# Patient Record
Sex: Female | Born: 1965 | Race: White | Hispanic: Yes | Marital: Married | Smoking: Never smoker
Health system: Southern US, Community
[De-identification: ages and names within clinical notes are randomized; demographics above are authoritative.]

## PROBLEM LIST (undated history)

## (undated) DIAGNOSIS — E119 Type 2 diabetes mellitus without complications: Secondary | ICD-10-CM

## (undated) DIAGNOSIS — N83209 Unspecified ovarian cyst, unspecified side: Secondary | ICD-10-CM

## (undated) DIAGNOSIS — R0602 Shortness of breath: Secondary | ICD-10-CM

## (undated) DIAGNOSIS — N189 Chronic kidney disease, unspecified: Secondary | ICD-10-CM

## (undated) DIAGNOSIS — J069 Acute upper respiratory infection, unspecified: Secondary | ICD-10-CM

## (undated) DIAGNOSIS — R51 Headache: Secondary | ICD-10-CM

## (undated) DIAGNOSIS — K219 Gastro-esophageal reflux disease without esophagitis: Secondary | ICD-10-CM

## (undated) DIAGNOSIS — M1990 Unspecified osteoarthritis, unspecified site: Secondary | ICD-10-CM

## (undated) DIAGNOSIS — G4730 Sleep apnea, unspecified: Secondary | ICD-10-CM

## (undated) DIAGNOSIS — F32A Depression, unspecified: Secondary | ICD-10-CM

## (undated) DIAGNOSIS — F329 Major depressive disorder, single episode, unspecified: Secondary | ICD-10-CM

## (undated) HISTORY — PX: BREAST SURGERY: SHX581

---

## 2006-09-27 ENCOUNTER — Ambulatory Visit: Payer: Self-pay | Admitting: Family Medicine

## 2006-09-28 ENCOUNTER — Ambulatory Visit: Payer: Self-pay | Admitting: *Deleted

## 2006-10-26 ENCOUNTER — Ambulatory Visit: Payer: Self-pay | Admitting: Family Medicine

## 2006-11-23 ENCOUNTER — Ambulatory Visit: Payer: Self-pay | Admitting: Family Medicine

## 2007-05-04 ENCOUNTER — Ambulatory Visit: Payer: Self-pay | Admitting: Internal Medicine

## 2007-05-10 ENCOUNTER — Ambulatory Visit: Payer: Self-pay | Admitting: Family Medicine

## 2007-05-30 ENCOUNTER — Emergency Department (HOSPITAL_COMMUNITY): Admission: EM | Admit: 2007-05-30 | Discharge: 2007-05-30 | Payer: Self-pay | Admitting: Emergency Medicine

## 2007-06-09 ENCOUNTER — Ambulatory Visit: Payer: Self-pay | Admitting: Family Medicine

## 2007-08-11 ENCOUNTER — Ambulatory Visit: Payer: Self-pay | Admitting: Family Medicine

## 2007-08-29 ENCOUNTER — Ambulatory Visit: Payer: Self-pay | Admitting: Internal Medicine

## 2007-09-19 ENCOUNTER — Encounter (INDEPENDENT_AMBULATORY_CARE_PROVIDER_SITE_OTHER): Payer: Self-pay | Admitting: Family Medicine

## 2007-09-19 ENCOUNTER — Ambulatory Visit: Payer: Self-pay | Admitting: Family Medicine

## 2007-09-27 ENCOUNTER — Ambulatory Visit: Payer: Self-pay | Admitting: Internal Medicine

## 2007-12-15 ENCOUNTER — Ambulatory Visit: Payer: Self-pay | Admitting: Internal Medicine

## 2008-02-23 ENCOUNTER — Ambulatory Visit: Payer: Self-pay | Admitting: Family Medicine

## 2008-10-31 ENCOUNTER — Ambulatory Visit: Payer: Self-pay | Admitting: Family Medicine

## 2008-11-29 ENCOUNTER — Ambulatory Visit: Payer: Self-pay | Admitting: Family Medicine

## 2008-12-28 ENCOUNTER — Ambulatory Visit: Payer: Self-pay | Admitting: Family Medicine

## 2009-01-02 ENCOUNTER — Ambulatory Visit (HOSPITAL_COMMUNITY): Admission: RE | Admit: 2009-01-02 | Discharge: 2009-01-02 | Payer: Self-pay | Admitting: Internal Medicine

## 2009-06-18 ENCOUNTER — Ambulatory Visit: Payer: Self-pay | Admitting: Family Medicine

## 2009-06-18 LAB — CONVERTED CEMR LAB
Albumin: 4.3 g/dL (ref 3.5–5.2)
CO2: 24 meq/L (ref 19–32)
Calcium: 9 mg/dL (ref 8.4–10.5)
Cholesterol: 166 mg/dL (ref 0–200)
Eosinophils Relative: 0 % (ref 0–5)
GC Probe Amp, Genital: NEGATIVE
Glucose, Bld: 86 mg/dL (ref 70–99)
HCT: 42.7 % (ref 36.0–46.0)
Hemoglobin: 13.6 g/dL (ref 12.0–15.0)
Lymphocytes Relative: 25 % (ref 12–46)
MCHC: 31.9 g/dL (ref 30.0–36.0)
Monocytes Absolute: 0.7 10*3/uL (ref 0.1–1.0)
Monocytes Relative: 10 % (ref 3–12)
Neutro Abs: 4.6 10*3/uL (ref 1.7–7.7)
Potassium: 4 meq/L (ref 3.5–5.3)
RBC: 4.5 M/uL (ref 3.87–5.11)
Sodium: 142 meq/L (ref 135–145)
Total Protein: 7.4 g/dL (ref 6.0–8.3)
Triglycerides: 118 mg/dL (ref <150)
Vit D, 25-Hydroxy: 23 ng/mL — ABNORMAL LOW (ref 30–89)

## 2009-06-19 ENCOUNTER — Ambulatory Visit (HOSPITAL_COMMUNITY): Admission: RE | Admit: 2009-06-19 | Discharge: 2009-06-19 | Payer: Self-pay | Admitting: Family Medicine

## 2009-07-16 ENCOUNTER — Ambulatory Visit: Payer: Self-pay | Admitting: Family Medicine

## 2009-11-09 ENCOUNTER — Emergency Department (HOSPITAL_COMMUNITY): Admission: EM | Admit: 2009-11-09 | Discharge: 2009-11-09 | Payer: Self-pay | Admitting: Family Medicine

## 2010-02-28 ENCOUNTER — Ambulatory Visit (HOSPITAL_COMMUNITY): Admission: RE | Admit: 2010-02-28 | Discharge: 2010-02-28 | Payer: Self-pay | Admitting: Internal Medicine

## 2010-03-20 ENCOUNTER — Encounter: Admission: RE | Admit: 2010-03-20 | Discharge: 2010-03-20 | Payer: Self-pay | Admitting: Family Medicine

## 2010-04-15 ENCOUNTER — Encounter (INDEPENDENT_AMBULATORY_CARE_PROVIDER_SITE_OTHER): Payer: Self-pay | Admitting: Family Medicine

## 2010-04-28 ENCOUNTER — Ambulatory Visit
Admission: RE | Admit: 2010-04-28 | Discharge: 2010-04-28 | Payer: Self-pay | Source: Home / Self Care | Attending: General Surgery | Admitting: General Surgery

## 2010-05-28 ENCOUNTER — Emergency Department (HOSPITAL_COMMUNITY)
Admission: EM | Admit: 2010-05-28 | Discharge: 2010-05-28 | Payer: Self-pay | Source: Home / Self Care | Admitting: Family Medicine

## 2010-07-15 ENCOUNTER — Ambulatory Visit (HOSPITAL_COMMUNITY)
Admission: RE | Admit: 2010-07-15 | Discharge: 2010-07-15 | Disposition: A | Discharge status: Home / Self Care | Payer: Self-pay | Source: Ambulatory Visit | Attending: Family Medicine | Admitting: Family Medicine

## 2010-07-15 DIAGNOSIS — R002 Palpitations: Secondary | ICD-10-CM | POA: Insufficient documentation

## 2010-07-17 ENCOUNTER — Other Ambulatory Visit (HOSPITAL_COMMUNITY): Payer: Self-pay | Admitting: Family Medicine

## 2010-07-17 ENCOUNTER — Encounter (INDEPENDENT_AMBULATORY_CARE_PROVIDER_SITE_OTHER): Payer: Self-pay | Admitting: Family Medicine

## 2010-07-17 DIAGNOSIS — K219 Gastro-esophageal reflux disease without esophagitis: Secondary | ICD-10-CM

## 2010-07-17 LAB — CONVERTED CEMR LAB
Rhuematoid fact SerPl-aCnc: <10 intl units/mL (ref <=14)
Sed Rate: 1 mm/hr (ref 0–22)
Vit D, 25-Hydroxy: 29 ng/mL — ABNORMAL LOW (ref 30–89)

## 2010-07-22 ENCOUNTER — Ambulatory Visit (HOSPITAL_COMMUNITY)
Admission: RE | Admit: 2010-07-22 | Discharge: 2010-07-22 | Disposition: A | Discharge status: Home / Self Care | Payer: Self-pay | Source: Ambulatory Visit | Attending: Family Medicine | Admitting: Family Medicine

## 2010-07-22 DIAGNOSIS — R1310 Dysphagia, unspecified: Secondary | ICD-10-CM | POA: Insufficient documentation

## 2010-07-22 DIAGNOSIS — K219 Gastro-esophageal reflux disease without esophagitis: Secondary | ICD-10-CM | POA: Insufficient documentation

## 2010-07-29 LAB — POCT HEMOGLOBIN-HEMACUE: Hemoglobin: 15.2 g/dL — ABNORMAL HIGH (ref 12.0–15.0)

## 2010-09-16 ENCOUNTER — Inpatient Hospital Stay (INDEPENDENT_AMBULATORY_CARE_PROVIDER_SITE_OTHER)
Admission: RE | Admit: 2010-09-16 | Discharge: 2010-09-16 | Disposition: A | Discharge status: Home / Self Care | Payer: Self-pay | Source: Ambulatory Visit | Attending: Emergency Medicine | Admitting: Emergency Medicine

## 2010-09-16 DIAGNOSIS — J019 Acute sinusitis, unspecified: Secondary | ICD-10-CM

## 2010-09-16 DIAGNOSIS — J309 Allergic rhinitis, unspecified: Secondary | ICD-10-CM

## 2010-09-22 ENCOUNTER — Emergency Department (HOSPITAL_COMMUNITY)
Admission: EM | Admit: 2010-09-22 | Discharge: 2010-09-23 | Disposition: A | Discharge status: Home / Self Care | Payer: Self-pay | Attending: Emergency Medicine | Admitting: Emergency Medicine

## 2010-09-22 ENCOUNTER — Inpatient Hospital Stay (INDEPENDENT_AMBULATORY_CARE_PROVIDER_SITE_OTHER)
Admission: RE | Admit: 2010-09-22 | Discharge: 2010-09-22 | Disposition: A | Discharge status: Home / Self Care | Payer: Self-pay | Source: Ambulatory Visit | Attending: Family Medicine | Admitting: Family Medicine

## 2010-09-22 ENCOUNTER — Emergency Department (HOSPITAL_COMMUNITY): Payer: Self-pay

## 2010-09-22 ENCOUNTER — Ambulatory Visit (INDEPENDENT_AMBULATORY_CARE_PROVIDER_SITE_OTHER): Payer: Self-pay

## 2010-09-22 DIAGNOSIS — D367 Benign neoplasm of other specified sites: Secondary | ICD-10-CM | POA: Insufficient documentation

## 2010-09-22 DIAGNOSIS — R1031 Right lower quadrant pain: Secondary | ICD-10-CM | POA: Insufficient documentation

## 2010-09-22 DIAGNOSIS — R630 Anorexia: Secondary | ICD-10-CM | POA: Insufficient documentation

## 2010-09-22 DIAGNOSIS — R10819 Abdominal tenderness, unspecified site: Secondary | ICD-10-CM

## 2010-09-22 DIAGNOSIS — R110 Nausea: Secondary | ICD-10-CM | POA: Insufficient documentation

## 2010-09-22 DIAGNOSIS — K219 Gastro-esophageal reflux disease without esophagitis: Secondary | ICD-10-CM | POA: Insufficient documentation

## 2010-09-22 LAB — CBC
MCH: 31 pg (ref 26.0–34.0)
Platelets: 398 10*3/uL (ref 150–400)
RBC: 4.55 MIL/uL (ref 3.87–5.11)

## 2010-09-22 LAB — URINALYSIS, ROUTINE W REFLEX MICROSCOPIC
Ketones, ur: NEGATIVE mg/dL
Nitrite: NEGATIVE
Protein, ur: NEGATIVE mg/dL
Urobilinogen, UA: 0.2 mg/dL (ref 0.0–1.0)
pH: 5.5 (ref 5.0–8.0)

## 2010-09-22 LAB — BASIC METABOLIC PANEL
BUN: 21 mg/dL (ref 6–23)
Creatinine, Ser: 0.71 mg/dL (ref 0.4–1.2)
GFR calc non Af Amer: >60 mL/min (ref >60)
Potassium: 3.4 mEq/L — ABNORMAL LOW (ref 3.5–5.1)

## 2010-09-22 LAB — DIFFERENTIAL
Basophils Relative: 0 % (ref 0–1)
Eosinophils Absolute: 0 10*3/uL (ref 0.0–0.7)
Monocytes Relative: 8 % (ref 3–12)
Neutrophils Relative %: 54 % (ref 43–77)

## 2010-09-22 LAB — POCT PREGNANCY, URINE: Preg Test, Ur: NEGATIVE

## 2010-09-23 ENCOUNTER — Encounter (HOSPITAL_COMMUNITY): Payer: Self-pay | Admitting: Radiology

## 2010-09-23 MED ORDER — IOHEXOL 300 MG/ML  SOLN
80.0000 mL | Freq: Once | INTRAMUSCULAR | Status: AC | PRN
  Administered 2010-09-23: 80 mL via INTRAVENOUS

## 2010-11-12 ENCOUNTER — Encounter: Payer: Self-pay | Admitting: Obstetrics & Gynecology

## 2010-11-12 ENCOUNTER — Encounter (INDEPENDENT_AMBULATORY_CARE_PROVIDER_SITE_OTHER): Payer: Self-pay | Admitting: Obstetrics & Gynecology

## 2010-11-12 DIAGNOSIS — Z01818 Encounter for other preprocedural examination: Secondary | ICD-10-CM

## 2010-11-12 DIAGNOSIS — N949 Unspecified condition associated with female genital organs and menstrual cycle: Secondary | ICD-10-CM

## 2010-11-12 DIAGNOSIS — D279 Benign neoplasm of unspecified ovary: Secondary | ICD-10-CM

## 2010-11-13 NOTE — Group Therapy Note (Signed)
Maria Stephenson, Maria Stephenson            ACCOUNT NO.:  [id number]  MEDICAL RECORD NO.:  [id number]           PATIENT TYPE:  A  LOCATION:  [facility]                   FACILITY:  [facility]  PHYSICIAN:  Jaynie Collins, MD     DATE OF BIRTH:  01-Dec-1965  DATE OF SERVICE:  11/12/2010                                 CLINIC NOTE  REASON FOR VISIT:  Follow up pelvic mass.  The patient is a 45 year old gravida 3, para 3 who was referred for [facility] ER after CT scan showed a 7.5-cm large mass in the pelvis concerning for dermoid cyst.  The patient had presented with abdominal pain.  She was sent here for further evaluation and surgical management. The patient still complains of having pain in her right lower quadrant but denies any other symptoms.  PAST OB/GYN HISTORY:  The patient has regular periods.  She uses condoms for contraception.  She has had 3 cesarean sections.  Her last Pap smear was in 2011.  She denies any abnormal Pap smear or any other gynecologic conditions.  PAST MEDICAL HISTORY:  None.  PAST SURGICAL HISTORY:  Cesarean section x3.  MEDICATIONS:  Mucinex D as needed, Protonix as needed, and Tylenol as needed for pain.  ALLERGIES:  No known drug allergies.  SOCIAL HISTORY:  The patient denies any habits.  FAMILY HISTORY:  Noncontributory.  PHYSICAL EXAMINATION:  VITAL SIGNS:  Temperature is 99.1, pulse 96, blood pressure 118/75, weight 113.4 pounds, height 53.75 inches. GENERAL:  No apparent distress. ABDOMEN:  Soft.  Well-healed infraumbilical vertical incision from her cesarean section.  Her pelvic mass was not able to be palpated.  The patient was tender in the right lower quadrant.  No rebound or guarding. EXTREMITIES:  No cyanosis, clubbing, or edema.  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  The patient was told that the only way to treat this will be surgically and given the size  of the cyst and her prior surgeries, laparotomy is indicated.  She was told that the Pfannenstiel incision will be made to try to get to the cyst and that it is very likely going to be an oophorectomy.  The risk of surgery were explained in detail including but not limited to bleeding, infection, injury to surrounding organs, need for additional procedures and all her questions were answered.  The patient was also told about the risk of thromboembolic phenomenon and other postoperative complications.  She was told to expect be contacted by our surgical scheduler regarding time and date of her surgery.  The patient was given a prescription of Percocet and diclofenac DR to use as needed for pain. She was told to call or come back in for any further gynecologic concerns.  The patient is up-to-date with her preventative health maintenance issues.          ______________________________ Jaynie Collins, MD    UA/MEDQ  D:  11/12/2010  T:  11/13/2010  Job:  [id number]

## 2010-12-15 ENCOUNTER — Encounter (HOSPITAL_COMMUNITY)
Admission: RE | Admit: 2010-12-15 | Discharge: 2010-12-15 | Disposition: A | Discharge status: Home / Self Care | Payer: Self-pay | Source: Ambulatory Visit | Attending: Obstetrics & Gynecology | Admitting: Obstetrics & Gynecology

## 2010-12-15 ENCOUNTER — Encounter (HOSPITAL_COMMUNITY): Payer: Self-pay

## 2010-12-15 ENCOUNTER — Other Ambulatory Visit (HOSPITAL_COMMUNITY): Payer: Self-pay

## 2010-12-15 HISTORY — DX: Acute upper respiratory infection, unspecified: J06.9

## 2010-12-15 HISTORY — DX: Sleep apnea, unspecified: G47.30

## 2010-12-15 HISTORY — DX: Shortness of breath: R06.02

## 2010-12-15 HISTORY — DX: Depression, unspecified: F32.A

## 2010-12-15 HISTORY — DX: Chronic kidney disease, unspecified: N18.9

## 2010-12-15 HISTORY — DX: Major depressive disorder, single episode, unspecified: F32.9

## 2010-12-15 HISTORY — DX: Gastro-esophageal reflux disease without esophagitis: K21.9

## 2010-12-15 HISTORY — DX: Headache: R51

## 2010-12-15 HISTORY — DX: Unspecified osteoarthritis, unspecified site: M19.90

## 2010-12-15 LAB — CBC
Hemoglobin: 14.1 g/dL (ref 12.0–15.0)
MCH: 32 pg (ref 26.0–34.0)
MCV: 95.9 fL (ref 78.0–100.0)
RBC: 4.41 MIL/uL (ref 3.87–5.11)

## 2010-12-15 LAB — SURGICAL PCR SCREEN: MRSA, PCR: NEGATIVE

## 2010-12-15 NOTE — Patient Instructions (Signed)
20 Maria Stephenson  12/15/2010   Your procedure is scheduled on:  12/22/10  Report to [facility] at 11:15 AM.Main entrance  Call this number if you have problems the morning of surgery: [phone]   Remember:   Do not eat food:After Midnight.  Do not drink clear liquids: 4 Hours before arrival.until 0830 am on Monday  Take these medicines the morning of surgery with A SIP OF WATER: Protonix   Do not wear jewelry, make-up or nail polish.  Do not bring valuables to the hospital.  Contacts, dentures or bridgework may not be worn into surgery.  Leave suitcase in the car. After surgery it may be brought to your room.  For patients admitted to the hospital, checkout time is 11:00 AM the day of discharge.   Patients discharged the day of surgery will not be allowed to drive home.  Name and phone number of your driver: [phone]  Special Instructions: N/A   Please read over the following fact sheets that you were given

## 2010-12-22 ENCOUNTER — Encounter (HOSPITAL_COMMUNITY): Payer: Self-pay | Admitting: Anesthesiology

## 2010-12-22 ENCOUNTER — Encounter (HOSPITAL_COMMUNITY): Payer: Self-pay | Admitting: *Deleted

## 2010-12-22 ENCOUNTER — Inpatient Hospital Stay (HOSPITAL_COMMUNITY)
Admission: RE | Admit: 2010-12-22 | Discharge: 2010-12-24 | DRG: 743 | Disposition: A | Discharge status: Home / Self Care | Payer: Self-pay | Source: Ambulatory Visit | Attending: Obstetrics & Gynecology | Admitting: Obstetrics & Gynecology

## 2010-12-22 ENCOUNTER — Other Ambulatory Visit: Payer: Self-pay | Admitting: Obstetrics & Gynecology

## 2010-12-22 ENCOUNTER — Ambulatory Visit (HOSPITAL_COMMUNITY): Payer: Self-pay | Admitting: Anesthesiology

## 2010-12-22 ENCOUNTER — Encounter (HOSPITAL_COMMUNITY)
Admission: RE | Disposition: A | Discharge status: Home / Self Care | Payer: Self-pay | Source: Ambulatory Visit | Attending: Obstetrics & Gynecology

## 2010-12-22 DIAGNOSIS — N838 Other noninflammatory disorders of ovary, fallopian tube and broad ligament: Secondary | ICD-10-CM

## 2010-12-22 DIAGNOSIS — D279 Benign neoplasm of unspecified ovary: Principal | ICD-10-CM | POA: Diagnosis present

## 2010-12-22 DIAGNOSIS — Z01812 Encounter for preprocedural laboratory examination: Secondary | ICD-10-CM

## 2010-12-22 DIAGNOSIS — Z01818 Encounter for other preprocedural examination: Secondary | ICD-10-CM

## 2010-12-22 DIAGNOSIS — R1900 Intra-abdominal and pelvic swelling, mass and lump, unspecified site: Secondary | ICD-10-CM

## 2010-12-22 HISTORY — PX: LAPAROTOMY: SHX154

## 2010-12-22 HISTORY — PX: SALPINGOOPHORECTOMY: SHX82

## 2010-12-22 LAB — TYPE AND SCREEN: Antibody Screen: NEGATIVE

## 2010-12-22 LAB — ABO/RH: ABO/RH(D): O POS

## 2010-12-22 SURGERY — LAPAROTOMY, EXPLORATORY
Anesthesia: General | Laterality: Right

## 2010-12-22 MED ORDER — NEOSTIGMINE METHYLSULFATE 1 MG/ML IJ SOLN
INTRAMUSCULAR | Status: AC
  Filled 2010-12-22: qty 10

## 2010-12-22 MED ORDER — PROPOFOL 10 MG/ML IV EMUL
INTRAVENOUS | Status: DC | PRN
  Administered 2010-12-22: 100 mg via INTRAVENOUS

## 2010-12-22 MED ORDER — HYDROMORPHONE HCL 1 MG/ML IJ SOLN
INTRAMUSCULAR | Status: AC
  Filled 2010-12-22: qty 1

## 2010-12-22 MED ORDER — HYDROMORPHONE HCL 1 MG/ML IJ SOLN
0.2500 mg | INTRAMUSCULAR | Status: DC | PRN
  Administered 2010-12-22 (×4): 0.5 mg via INTRAVENOUS

## 2010-12-22 MED ORDER — FAMOTIDINE 20 MG PO TABS: 20.0000 mg | ORAL_TABLET | Freq: Once | ORAL | Status: DC | PRN

## 2010-12-22 MED ORDER — PANTOPRAZOLE SODIUM 40 MG PO TBEC: 40.0000 mg | DELAYED_RELEASE_TABLET | Freq: Every day | ORAL | Status: DC

## 2010-12-22 MED ORDER — DEXAMETHASONE SODIUM PHOSPHATE 10 MG/ML IJ SOLN
INTRAMUSCULAR | Status: AC
  Filled 2010-12-22: qty 1

## 2010-12-22 MED ORDER — NALOXONE HCL 0.4 MG/ML IJ SOLN: 0.4000 mg | INTRAMUSCULAR | Status: DC | PRN

## 2010-12-22 MED ORDER — ALUM & MAG HYDROXIDE-SIMETH 200-200-20 MG/5ML PO SUSP: 30.0000 mL | ORAL | Status: DC | PRN

## 2010-12-22 MED ORDER — PANTOPRAZOLE SODIUM 40 MG PO TBEC
DELAYED_RELEASE_TABLET | ORAL | Status: AC
  Filled 2010-12-22: qty 1

## 2010-12-22 MED ORDER — DOCUSATE SODIUM 100 MG PO CAPS
100.0000 mg | ORAL_CAPSULE | Freq: Two times a day (BID) | ORAL | Status: DC
  Administered 2010-12-23 – 2010-12-24 (×3): 100 mg via ORAL
  Filled 2010-12-22 (×3): qty 1

## 2010-12-22 MED ORDER — HYDROMORPHONE 0.3 MG/ML IV SOLN
INTRAVENOUS | Status: DC
  Administered 2010-12-22: 17:00:00 via INTRAVENOUS
  Administered 2010-12-22 – 2010-12-23 (×2): 0.3 mg via INTRAVENOUS
  Administered 2010-12-23: 5 mL via INTRAVENOUS
  Administered 2010-12-23: 0.6 mg via INTRAVENOUS

## 2010-12-22 MED ORDER — ONDANSETRON HCL 4 MG/2ML IJ SOLN
INTRAMUSCULAR | Status: AC
  Filled 2010-12-22: qty 2

## 2010-12-22 MED ORDER — CITRIC ACID-SODIUM CITRATE 334-500 MG/5ML PO SOLN: 30.0000 mL | Freq: Once | ORAL | Status: DC | PRN

## 2010-12-22 MED ORDER — MIDAZOLAM HCL 2 MG/2ML IJ SOLN
INTRAMUSCULAR | Status: AC
  Filled 2010-12-22: qty 2

## 2010-12-22 MED ORDER — ROCURONIUM BROMIDE 100 MG/10ML IV SOLN
INTRAVENOUS | Status: DC | PRN
  Administered 2010-12-22: 30 mg via INTRAVENOUS

## 2010-12-22 MED ORDER — MAGNESIUM CITRATE PO SOLN
296.0000 mL | Freq: Every day | ORAL | Status: DC | PRN
  Filled 2010-12-22: qty 296

## 2010-12-22 MED ORDER — ONDANSETRON HCL 4 MG/2ML IJ SOLN: 4.0000 mg | Freq: Four times a day (QID) | INTRAMUSCULAR | Status: DC | PRN

## 2010-12-22 MED ORDER — BUPIVACAINE HCL (PF) 0.25 % IJ SOLN
INTRAMUSCULAR | Status: DC | PRN
  Administered 2010-12-22: 20 mL

## 2010-12-22 MED ORDER — LACTATED RINGERS IV SOLN
INTRAVENOUS | Status: DC
  Administered 2010-12-22 (×2): via INTRAVENOUS

## 2010-12-22 MED ORDER — LACTATED RINGERS IV SOLN
INTRAVENOUS | Status: DC
  Administered 2010-12-22 – 2010-12-23 (×2): via INTRAVENOUS

## 2010-12-22 MED ORDER — MENTHOL 3 MG MT LOZG: 1.0000 | LOZENGE | OROMUCOSAL | Status: DC | PRN

## 2010-12-22 MED ORDER — SIMETHICONE 80 MG PO CHEW: 80.0000 mg | CHEWABLE_TABLET | Freq: Four times a day (QID) | ORAL | Status: DC | PRN

## 2010-12-22 MED ORDER — SCOPOLAMINE 1 MG/3DAYS TD PT72: 1.0000 | MEDICATED_PATCH | Freq: Once | TRANSDERMAL | Status: DC | PRN

## 2010-12-22 MED ORDER — FENTANYL CITRATE 0.05 MG/ML IJ SOLN
INTRAMUSCULAR | Status: DC | PRN
Start: 2010-12-22 — End: 2010-12-22
  Administered 2010-12-22 (×2): 50 ug via INTRAVENOUS

## 2010-12-22 MED ORDER — SODIUM CHLORIDE 0.9 % IJ SOLN: 9.0000 mL | INTRAMUSCULAR | Status: DC | PRN

## 2010-12-22 MED ORDER — IBUPROFEN 600 MG PO TABS
600.0000 mg | ORAL_TABLET | Freq: Four times a day (QID) | ORAL | Status: DC | PRN
  Administered 2010-12-23: 600 mg via ORAL
  Filled 2010-12-22: qty 1

## 2010-12-22 MED ORDER — PROPOFOL 10 MG/ML IV EMUL
INTRAVENOUS | Status: AC
  Filled 2010-12-22: qty 20

## 2010-12-22 MED ORDER — FENTANYL CITRATE 0.05 MG/ML IJ SOLN
INTRAMUSCULAR | Status: AC
  Filled 2010-12-22: qty 5

## 2010-12-22 MED ORDER — HYDROMORPHONE 0.3 MG/ML IV SOLN
INTRAVENOUS | Status: AC
  Filled 2010-12-22: qty 25

## 2010-12-22 MED ORDER — SENNOSIDES-DOCUSATE SODIUM 8.6-50 MG PO TABS: 2.0000 | ORAL_TABLET | Freq: Every day | ORAL | Status: DC | PRN

## 2010-12-22 MED ORDER — ROCURONIUM BROMIDE 50 MG/5ML IV SOLN
INTRAVENOUS | Status: AC
  Filled 2010-12-22: qty 1

## 2010-12-22 MED ORDER — ONDANSETRON HCL 4 MG/2ML IJ SOLN
INTRAMUSCULAR | Status: DC | PRN
  Administered 2010-12-22: 4 mg via INTRAVENOUS

## 2010-12-22 MED ORDER — KETOROLAC TROMETHAMINE 30 MG/ML IJ SOLN: 30.0000 mg | Freq: Once | INTRAMUSCULAR | Status: DC

## 2010-12-22 MED ORDER — DEXAMETHASONE SODIUM PHOSPHATE 10 MG/ML IJ SOLN
INTRAMUSCULAR | Status: DC | PRN
  Administered 2010-12-22: 8 mg via INTRAVENOUS

## 2010-12-22 MED ORDER — MIDAZOLAM HCL 5 MG/5ML IJ SOLN
INTRAMUSCULAR | Status: DC | PRN
  Administered 2010-12-22: 1 mg via INTRAVENOUS

## 2010-12-22 MED ORDER — ZOLPIDEM TARTRATE 5 MG PO TABS: 5.0000 mg | ORAL_TABLET | Freq: Every evening | ORAL | Status: DC | PRN

## 2010-12-22 MED ORDER — MAGNESIUM HYDROXIDE 400 MG/5ML PO SUSP: 30.0000 mL | Freq: Every day | ORAL | Status: DC | PRN

## 2010-12-22 MED ORDER — OXYCODONE-ACETAMINOPHEN 5-325 MG PO TABS
1.0000 | ORAL_TABLET | ORAL | Status: DC | PRN
  Administered 2010-12-23 (×2): 1 via ORAL
  Filled 2010-12-22 (×2): qty 1

## 2010-12-22 MED ORDER — BISACODYL 10 MG RE SUPP: 10.0000 mg | Freq: Every day | RECTAL | Status: DC | PRN

## 2010-12-22 MED ORDER — LIDOCAINE HCL (CARDIAC) 20 MG/ML IV SOLN
INTRAVENOUS | Status: AC
  Filled 2010-12-22: qty 5

## 2010-12-22 MED ORDER — KETOROLAC TROMETHAMINE 30 MG/ML IJ SOLN
INTRAMUSCULAR | Status: AC
  Filled 2010-12-22: qty 1

## 2010-12-22 MED ORDER — DIPHENHYDRAMINE HCL 50 MG/ML IJ SOLN: 12.5000 mg | Freq: Four times a day (QID) | INTRAMUSCULAR | Status: DC | PRN

## 2010-12-22 MED ORDER — LORATADINE 10 MG PO TABS
10.0000 mg | ORAL_TABLET | Freq: Every day | ORAL | Status: DC
  Administered 2010-12-23 – 2010-12-24 (×2): 10 mg via ORAL
  Filled 2010-12-22 (×3): qty 1

## 2010-12-22 MED ORDER — GLYCOPYRROLATE 0.2 MG/ML IJ SOLN
INTRAMUSCULAR | Status: AC
  Filled 2010-12-22: qty 2

## 2010-12-22 MED ORDER — HYDROMORPHONE HCL 1 MG/ML IJ SOLN: 0.2000 mg | INTRAMUSCULAR | Status: DC | PRN

## 2010-12-22 MED ORDER — GLYCOPYRROLATE 0.2 MG/ML IJ SOLN
INTRAMUSCULAR | Status: DC | PRN
  Administered 2010-12-22: .6 mg via INTRAVENOUS

## 2010-12-22 MED ORDER — ONDANSETRON HCL 4 MG/2ML IJ SOLN
4.0000 mg | Freq: Four times a day (QID) | INTRAMUSCULAR | Status: DC | PRN
  Administered 2010-12-22: 4 mg via INTRAVENOUS
  Filled 2010-12-22: qty 2

## 2010-12-22 MED ORDER — PANTOPRAZOLE SODIUM 40 MG PO TBEC
40.0000 mg | DELAYED_RELEASE_TABLET | Freq: Every day | ORAL | Status: DC
  Administered 2010-12-23: 40 mg via ORAL
  Filled 2010-12-22 (×3): qty 1

## 2010-12-22 MED ORDER — KETOROLAC TROMETHAMINE 30 MG/ML IJ SOLN: 15.0000 mg | Freq: Once | INTRAMUSCULAR | Status: DC | PRN

## 2010-12-22 MED ORDER — CEFAZOLIN SODIUM 1-5 GM-% IV SOLN
1.0000 g | INTRAVENOUS | Status: AC
  Administered 2010-12-22: 1 g via INTRAVENOUS

## 2010-12-22 MED ORDER — PANTOPRAZOLE SODIUM 40 MG PO TBEC: 40.0000 mg | DELAYED_RELEASE_TABLET | Freq: Once | ORAL | Status: DC | PRN

## 2010-12-22 MED ORDER — GLYCOPYRROLATE 0.2 MG/ML IJ SOLN
INTRAMUSCULAR | Status: AC
  Filled 2010-12-22: qty 1

## 2010-12-22 MED ORDER — METOCLOPRAMIDE HCL 10 MG PO TABS: 10.0000 mg | ORAL_TABLET | Freq: Once | ORAL | Status: DC | PRN

## 2010-12-22 MED ORDER — DIPHENHYDRAMINE HCL 12.5 MG/5ML PO ELIX: 12.5000 mg | ORAL_SOLUTION | Freq: Four times a day (QID) | ORAL | Status: DC | PRN

## 2010-12-22 MED ORDER — ONDANSETRON HCL 4 MG PO TABS: 4.0000 mg | ORAL_TABLET | Freq: Four times a day (QID) | ORAL | Status: DC | PRN

## 2010-12-22 MED ORDER — KETOROLAC TROMETHAMINE 30 MG/ML IJ SOLN
INTRAMUSCULAR | Status: DC | PRN
  Administered 2010-12-22: 30 mg via INTRAVENOUS

## 2010-12-22 MED ORDER — LIDOCAINE HCL (CARDIAC) 20 MG/ML IV SOLN
INTRAVENOUS | Status: DC | PRN
  Administered 2010-12-22: 40 mg via INTRAVENOUS

## 2010-12-22 MED ORDER — NEOSTIGMINE METHYLSULFATE 1 MG/ML IJ SOLN
INTRAMUSCULAR | Status: DC | PRN
  Administered 2010-12-22: 3 mg via INTRAVENOUS

## 2010-12-22 SURGICAL SUPPLY — 46 items
APL SKNCLS STERI-STRIP NONHPOA (GAUZE/BANDAGES/DRESSINGS) ×2
BARRIER ADHS 3X4 INTERCEED (GAUZE/BANDAGES/DRESSINGS) IMPLANT
BENZOIN TINCTURE PRP APPL 2/3 (GAUZE/BANDAGES/DRESSINGS) ×1 IMPLANT
BRR ADH 4X3 ABS CNTRL BYND (GAUZE/BANDAGES/DRESSINGS)
CANISTER SUCTION 2500CC (MISCELLANEOUS) ×3 IMPLANT
CELLS DAT CNTRL 66122 CELL SVR (MISCELLANEOUS) IMPLANT
CHLORAPREP W/TINT 26ML (MISCELLANEOUS) ×3 IMPLANT
CLOSURE STERI STRIP 1/2 X4 (GAUZE/BANDAGES/DRESSINGS) ×1 IMPLANT
CLOTH BEACON ORANGE TIMEOUT ST (SAFETY) ×3 IMPLANT
CONT PATH 16OZ SNAP LID 3702 (MISCELLANEOUS) ×3 IMPLANT
DECANTER SPIKE VIAL GLASS SM (MISCELLANEOUS) IMPLANT
DRAPE UTILITY XL STRL (DRAPES) ×3 IMPLANT
DRSG COVADERM 4X10 (GAUZE/BANDAGES/DRESSINGS) ×1 IMPLANT
GAUZE SPONGE 4X4 16PLY XRAY LF (GAUZE/BANDAGES/DRESSINGS) ×3 IMPLANT
GLOVE BIO SURGEON STRL SZ7 (GLOVE) ×3 IMPLANT
GLOVE BIOGEL PI IND STRL 7.0 (GLOVE) ×4 IMPLANT
GLOVE BIOGEL PI INDICATOR 7.0 (GLOVE) ×2
GOWN PREVENTION PLUS LG XLONG (DISPOSABLE) ×9 IMPLANT
GOWN STRL REIN XL XLG (GOWN DISPOSABLE) ×3 IMPLANT
NDL HYPO 25X1 1.5 SAFETY (NEEDLE) ×2 IMPLANT
NEEDLE HYPO 25X1 1.5 SAFETY (NEEDLE) ×3 IMPLANT
NS IRRIG 1000ML POUR BTL (IV SOLUTION) ×3 IMPLANT
PACK ABDOMINAL GYN (CUSTOM PROCEDURE TRAY) ×3 IMPLANT
PAD OB MATERNITY 4.3X12.25 (PERSONAL CARE ITEMS) ×3 IMPLANT
RETRACTOR WND ALEXIS 18 MED (MISCELLANEOUS) IMPLANT
RETRACTOR WND ALEXIS 25 LRG (MISCELLANEOUS) IMPLANT
RTRCTR WOUND ALEXIS 18CM MED (MISCELLANEOUS)
RTRCTR WOUND ALEXIS 25CM LRG (MISCELLANEOUS)
SPONGE LAP 18X18 X RAY DECT (DISPOSABLE) ×6 IMPLANT
STAPLER VISISTAT 35W (STAPLE) ×3 IMPLANT
SUT MNCRL AB 3-0 PS2 18 (SUTURE) ×1 IMPLANT
SUT MON AB 2-0 CT1 27 (SUTURE) ×1 IMPLANT
SUT PDS AB 0 CT1 27 (SUTURE) IMPLANT
SUT PDS AB 0 CTX 60 (SUTURE) ×6 IMPLANT
SUT PLAIN 2 0 XLH (SUTURE) IMPLANT
SUT VIC AB 0 CT1 27 (SUTURE) ×9
SUT VIC AB 0 CT1 27XBRD ANBCTR (SUTURE) IMPLANT
SUT VIC AB 2-0 SH 27 (SUTURE)
SUT VIC AB 2-0 SH 27XBRD (SUTURE) IMPLANT
SUT VIC AB 3-0 SH 27 (SUTURE)
SUT VIC AB 3-0 SH 27X BRD (SUTURE) IMPLANT
SUT VICRYL 0 TIES 12 18 (SUTURE) ×1 IMPLANT
SYR CONTROL 10ML LL (SYRINGE) ×3 IMPLANT
TOWEL OR 17X24 6PK STRL BLUE (TOWEL DISPOSABLE) ×6 IMPLANT
TRAY FOLEY CATH 14FR (SET/KITS/TRAYS/PACK) ×3 IMPLANT
WATER STERILE IRR 1000ML POUR (IV SOLUTION) ×3 IMPLANT

## 2010-12-22 NOTE — Anesthesia Postprocedure Evaluation (Signed)
  Anesthesia Post Note  Patient: Maria Stephenson  Procedure(s) Performed:  EXPLORATORY LAPAROTOMY; SALPINGO OOPHERECTOMY  Anesthesia type: GA  Patient location: PACU  Post pain: Pain level controlled  Post assessment: Post-op Vital signs reviewed  Last Vitals:  Filed Vitals:   12/22/10 1600  BP: 102/61  Pulse: 98  Temp:   Resp: 18    Post vital signs: Reviewed  Level of consciousness: sedated  Complications: No apparent anesthesia complications

## 2010-12-22 NOTE — Transfer of Care (Signed)
Immediate Anesthesia Transfer of Care Note  Patient: Maria Stephenson  Procedure(s) Performed:  EXPLORATORY LAPAROTOMY; SALPINGO OOPHERECTOMY  Patient Location: PACU  Anesthesia Type: General  Level of Consciousness: awake, alert  and oriented  Airway & Oxygen Therapy: Patient Spontanous Breathing and Patient connected to nasal cannula oxygen  Post-op Assessment: Report given to PACU RN, Post -op Vital signs reviewed and stable and Patient moving all extremities X 4  Post vital signs: Reviewed and stable  Complications: No apparent anesthesia complications

## 2010-12-22 NOTE — Preoperative (Signed)
Beta Blockers   Reason not to administer Beta Blockers:Not Applicable 

## 2010-12-22 NOTE — Anesthesia Preprocedure Evaluation (Signed)
Anesthesia Evaluation  Name, MR# and DOB Patient awake  General Assessment Comment  Reviewed: Allergy & Precautions, H&P , Patient's Chart, lab work & pertinent test results and reviewed documented beta blocker date and time   Airway Mallampati: I TM Distance: <3 FB Neck ROM: full    Dental  (+) Teeth Intact   Pulmonary (+) shortness of breath recent URI (clear sputum) and Residual Cough    clear to auscultation  breath sounds clear to auscultation none    Cardiovascular Exercise Tolerance: Good regular Normal    Neuro/Psych   Headaches (daily migraines)     GI/Hepatic/Renal negative Liver ROS, and negative Renal ROS (+)  GERD Medicated and Controlled     Endo/Other  Negative Endocrine ROS (+)      Abdominal   Musculoskeletal   Hematology negative hematology ROS (+)   Peds  Reproductive/Obstetrics    Anesthesia Other Findings             Anesthesia Physical Anesthesia Plan  ASA: II  Anesthesia Plan: General   Post-op Pain Management:    Induction:   Airway Management Planned:   Additional Equipment:   Intra-op Plan:   Post-operative Plan:   Informed Consent: I have reviewed the patients History and Physical, chart, labs and discussed the procedure including the risks, benefits and alternatives for the proposed anesthesia with the patient or authorized representative who has indicated his/her understanding and acceptance.   Dental Advisory Given  Plan Discussed with: CRNA and Surgeon  Anesthesia Plan Comments:         Anesthesia Quick Evaluation

## 2010-12-22 NOTE — H&P (Signed)
REASON FOR VISIT: Pelvic mass.  PROPOSED SURGERY: Exploratory laparotomy, removal of pelvic mass  HISTORY OF PRESENT ILLNESS: The patient is a 45 year old gravida 3, para 3 who was referred from [facility] ER after CT scan showed a 7.5-cm large mass in the pelvis concerning for dermoid cyst. The patient had presented with abdominal pain. She is here for surgical management.  The patient still complains of having pain in her right lower quadrant but denies any other symptoms.  PAST OB/GYN HISTORY: The patient has regular periods. She uses condoms for contraception. She has had 3 cesarean sections. Her last Pap smear was in 2011. She denies any abnormal Pap smear or any other gynecologic conditions.  PAST MEDICAL HISTORY: GERD, occasional headaches, occasional seasonal allergies/cold symptoms.  PAST SURGICAL HISTORY: Cesarean section x 3, breast biopsy that was benign (reports respiratory difficulties after biopsy, negative evaluation as per patient).  MEDICATIONS: Mucinex D as needed, Protonix as needed, and Tylenol as needed for pain.  ALLERGIES: No known drug allergies.  SOCIAL HISTORY: The patient denies any habits.  FAMILY HISTORY: Noncontributory.  PHYSICAL EXAMINATION:  VITALS: T98.48F P88  R18  BP118/78 GENERAL: No apparent distress.  LUNGS: CTAB HEART: RRR ABDOMEN: Soft. Well-healed infraumbilical vertical incision from her cesarean section. Her pelvic mass was not able to be palpated. The patient was tender in the right lower quadrant. No rebound or guarding.  EXTREMITIES: No cyanosis, clubbing, or edema.  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 The patient was told that the only way to treat this will be surgically and given the  size of the cyst and her prior surgeries, laparotomy is indicated. She was told that the Pfannenstiel incision will be made to try to get to the cyst and that it is very likely going  to be an oophorectomy. The risks of surgery were explained in detail including but not limited to bleeding, infection, injury to surrounding organs, need for additional procedures and all her questions were answered. The patient was also told about the risk of thromboembolic phenomenon and other postoperative complications.   ANYANWU,UGONNA A 12/22/2010 12:32 PM

## 2010-12-22 NOTE — Op Note (Signed)
Maria Stephenson PROCEDURE DATE: 12/22/2010  PREOPERATIVE DIAGNOSIS:  POSTOPERATIVE DIAGNOSIS: The same PROCEDURE: Exploratory laparotomy,  Right Salpingo-ophorectomy SURGEON:  Dr. Jaynie Collins ASSISTANT: Dr. Elsie Lincoln ANESTHESIOLOGIST: Dr. Dana Allan   INDICATIONS: 45 y.o. with 7 cm pelvic mass concerning for dermoid cyst, here for operative management.  On the day of surgery, the risks of surgery were again discussed with the patient including but not limited to: bleeding which may require transfusion or reoperation; infection which may require antibiotics; injury to bowel, bladder, ureters or other surrounding organs; need for additional procedures; thromboembolic phenomenon, incisional problems and other postoperative/anesthesia complications. Written informed consent was obtained.    OPERATIVE FINDINGS:  7 cm enlarged right ovary containing pelvic mass.  Normal uterus and left adnexa.  ESTIMATED BLOOD LOSS: 50 ml FLUIDS:  1000 ml of Lactated Ringers URINE OUTPUT:  25 ml of clear yellow urine. SPECIMENS: Right ovary and fallopian tube sent to pathology COMPLICATIONS:  None immediate.   DESCRIPTION OF PROCEDURE: The patient received intravenous antibiotics and had sequential compression devices applied to her lower extremities while in the preoperative area.   She was taken to the operating room and placed under general anesthesia without difficulty and found to be adequate.The abdomen and perineum were prepped and draped in a sterile manner, and a Foley catheter was inserted into the bladder and attached to constant drainage. After an adequate timeout was performed, a Pfannensteil skin incision was made. This incision was taken down to the fascia using electrocautery with care given to maintain good hemostasis. The fascia was incised in the midline and the fascial incision was then extended bilaterally using electrocautery without difficulty. The fascia was then dissected off the  underlying rectus muscles using blunt and sharp dissection. The rectus muscles were split bluntly in the midline and the peritoneum entered sharply without complication. This peritoneal incision was then extended superiorly and inferiorly with care given to prevent bowel or bladder injury. Upon entry into the abdominal cavity, the upper abdomen was inspected and found to be normal. Attention was then turned to the pelvis. There was minimal adhesive disease noted in the abdomen. The pelvic mass was recognized in the right ovary and it was delivered up out of the abdomen.  A hole was created in the clear portion of the posterior broad ligament, and the infundibulopelvic ligament clamped on the patient's right side. This pedicle was then clamped, cut, and doubly suture ligated.  The right adnexal pedicle was also clamped, cut and double suture ligated allowing right salpingo-ophorectomy.  Hemostasis was reconfirmed on all surfaces.  All laparotomy sponges and instruments were removed from the abdomen. The peritoneum and muscles were closed with 2-0 Monocryl in two interrupted stitches, and the fascia was closed with 0 Vicryl in a running fashion. The skin was closed with a 3-0 Monocryl subcuticular stitch. Sponge, lap, needle, and instrument counts were correct times two. The patient was taken to the recovery area awake, extubated and in stable condition.  [name] A 12/22/2010 2:04 PM

## 2010-12-23 DIAGNOSIS — N838 Other noninflammatory disorders of ovary, fallopian tube and broad ligament: Secondary | ICD-10-CM

## 2010-12-23 NOTE — Discharge Summary (Signed)
Physician Discharge Summary  Patient ID: Maria Stephenson MRN: [id number] DOB/AGE: [birth year] 47 y.o.  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  By time of discharge, she was ambulating, voiding without difficulty, tolerating regular diet and passing flatus.  She was deemed stable for discharge to home.    Discharge Exam: Blood pressure 95/59, pulse 62, temperature 98 F (36.7 C), temperature source Oral, resp. rate 18, last menstrual period 12/09/2010, SpO2 96.00%. General appearance: alert and no distress Resp: clear to auscultation bilaterally Cardio: regular rate and rhythm GI: soft, non-tender; bowel sounds normal; no masses,  no organomegaly and Incision is clean,dry and intact. No erythema, induration or drainage. Pelvic: vagina normal without discharge and No bleeding noted Extremities: extremities normal, atraumatic, no cyanosis or edema and Homans sign is negative, no sign of DVT Pulses: 2+ and symmetric  Disposition: Home or Self Care  Discharge Medications: Percocet, Ibuprofen and Colace as prescribed.  Continue home medications.  Discharge Orders    Future Appointments: Provider: Department: Dept Phone: Center:   01/28/2011 1:15 PM Tereso Newcomer, MD [facility] [phone] [facility]     Future Orders Please Complete By Expires   Discharge patient      Comments:   To home     Current Discharge Medication List    CONTINUE these medications which have NOT CHANGED   Details  pantoprazole (PROTONIX) 40 MG tablet Take 40 mg by mouth daily.      budesonide (RHINOCORT AQUA) 32 MCG/ACT nasal spray Place 1 spray into the nose daily as needed.  For allergies     loratadine (CLARITIN) 10 MG tablet Take 10 mg by mouth daily as needed. For allergies        Follow-up Information    Follow up with [facility] on 01/28/2011. (Appointment with Dr. Macon Large at 1:15pm)    Contact information:   [address] [place] [address]          Signed: Tereso Newcomer 12/24/2010, 8:06 AM

## 2010-12-23 NOTE — Progress Notes (Addendum)
CM referral received for possible medication assistance for patient. Spoke with patient via [employer], interpreter. Patient's concerns were concerning a medication that she had been on prior to her surgery to assist with pain that was $70. Explained that Dr. Macon Large was writing prescriptions for Percocet, Ibuprofen and Colace. Unable to assist with Percocet prescription and other medications available over-the-counter. Denies other discharge needs at this time.  UR Chart review completed.

## 2010-12-23 NOTE — Progress Notes (Signed)
Encounter addended by: Carlyle Lipa on: 12/23/2010  8:20 AM<BR>     Documentation filed: Notes Section

## 2010-12-23 NOTE — Progress Notes (Signed)
1 Day Post-Op Procedure(s): EXPLORATORY LAPAROTOMY SALPINGO OOPHERECTOMY  Subjective: Patient reports incisional pain and tolerating PO.  Denies flatus.  Objective: I have reviewed patient's vital signs, intake and output and medications.  General: alert and no distress Resp: clear to auscultation bilaterally Cardio: regular rate and rhythm GI: soft, non-tender; bowel sounds normal; no masses,  no organomegaly and incision: clean, dry, intact and bloody drainage present Extremities: Homans sign is negative, no sign of DVT and no edema, redness or tenderness in the calves or thighs Vaginal Bleeding: none  Assessment: s/p Procedure(s): EXPLORATORY LAPAROTOMY SALPINGO OOPHERECTOMY: stable and progressing well  Plan: Advance diet Encourage ambulation Advance to PO medication Discontinue IV fluids  LOS: 1 day    Terisha Losasso A 12/23/2010, 10:09 AM

## 2010-12-23 NOTE — Anesthesia Postprocedure Evaluation (Signed)
  Anesthesia Post-op Note  Patient: Maria Stephenson  Procedure(s) Performed:  EXPLORATORY LAPAROTOMY; SALPINGO OOPHERECTOMY  Patient Location: PACU and Mother/Baby  Anesthesia Type: General  Level of Consciousness: awake  Airway and Oxygen Therapy: Patient Spontanous Breathing  Post-op Pain: none  Post-op Assessment: Post-op Vital signs reviewed  Post-op Vital Signs: Reviewed and stable  Complications: No apparent anesthesia complications

## 2010-12-24 MED ORDER — IBUPROFEN 600 MG PO TABS: 600.0000 mg | ORAL_TABLET | Freq: Four times a day (QID) | ORAL | Status: AC | PRN

## 2010-12-24 MED ORDER — OXYCODONE-ACETAMINOPHEN 5-325 MG PO TABS: 1.0000 | ORAL_TABLET | ORAL | Status: AC | PRN

## 2010-12-24 MED ORDER — DSS 100 MG PO CAPS: 100.0000 mg | ORAL_CAPSULE | Freq: Two times a day (BID) | ORAL | Status: AC

## 2010-12-24 NOTE — Progress Notes (Signed)
2 Days Post-Op Procedure(s): EXPLORATORY LAPAROTOMY SALPINGO OOPHERECTOMY  Subjective: Patient reports tolerating PO, + flatus and no problems voiding.  Ambulating without difficulty.  Objective: I have reviewed patient's vital signs, intake and output, medications, pathology and patient was reassured because it was a benign mature cystic teratoma..  General: alert and no distress Resp: clear to auscultation bilaterally Cardio: regular rate and rhythm GI: soft, non-tender; bowel sounds normal; no masses,  no organomegaly and incision: clean, dry, intact and no drainage present Extremities: extremities normal, atraumatic, no cyanosis or edema and Homans sign is negative, no sign of DVT  Assessment: s/p Procedure(s): EXPLORATORY LAPAROTOMY SALPINGO OOPHERECTOMY: stable, progressing well and tolerating diet  Plan: Discharge home.  Follow up in clinic as scheduled.  LOS: 2 days    Maria Stephenson A 12/24/2010, 7:58 AM

## 2010-12-24 NOTE — Progress Notes (Signed)
12/24/2010 Maria Stephenson  Interpreter  I assisted Maria Stephenson  With discharge instructions.

## 2011-01-19 ENCOUNTER — Encounter (HOSPITAL_COMMUNITY): Payer: Self-pay | Admitting: Obstetrics & Gynecology

## 2011-01-28 ENCOUNTER — Ambulatory Visit (INDEPENDENT_AMBULATORY_CARE_PROVIDER_SITE_OTHER): Payer: Self-pay | Admitting: Obstetrics & Gynecology

## 2011-01-28 ENCOUNTER — Encounter: Payer: Self-pay | Admitting: Obstetrics & Gynecology

## 2011-01-28 VITALS — BP 127/76 | HR 77 | Temp 97.1°F | Ht <= 58 in | Wt 111.0 lb

## 2011-01-28 DIAGNOSIS — Z09 Encounter for follow-up examination after completed treatment for conditions other than malignant neoplasm: Secondary | ICD-10-CM

## 2011-01-28 NOTE — Progress Notes (Signed)
  Subjective:     Maria Stephenson is a 45 y.o. female who presents to the clinic 5 weeks status post ELAP, RSO for mature cystic teratoma.  Eating a regular diet without difficulty. Bowel movements are normal. The patient is not having any pain.  Pathology report showed benign mature cystic teratoma, benign fallopian tube.  The following portions of the patient's history were reviewed and updated as appropriate: allergies, current medications, past family history, past medical history, past social history, past surgical history and problem list.  Review of Systems A comprehensive review of systems was negative.    Objective:    BP 127/76  Pulse 77  Temp(Src) 97.1 F (36.2 C) (Oral)  Ht 4\' 7"  (1.397 m)  Wt 111 lb (50.349 kg)  BMI 25.80 kg/m2  LMP 01/19/2011 General:  alert and no distress  Abdomen: soft, bowel sounds active, non-tender, no abnormal masses  Incision:   healing well, no drainage, no erythema, no hernia, no seroma, no swelling, no dehiscence, incision well approximated     Assessment:    Doing well postoperatively. Operative findings again reviewed. Pathology report discussed.    Plan:    1. Continue any current medications. 2. Wound care discussed. 3. Activity restrictions: none 4. Follow up: 3 months for annual exam; she will schedule next mammogram in 03/2011 via [facility].

## 2011-02-16 ENCOUNTER — Other Ambulatory Visit (HOSPITAL_COMMUNITY): Payer: Self-pay | Admitting: Family Medicine

## 2011-02-16 ENCOUNTER — Other Ambulatory Visit: Payer: Self-pay | Admitting: Family Medicine

## 2011-02-16 DIAGNOSIS — N63 Unspecified lump in unspecified breast: Secondary | ICD-10-CM

## 2011-02-16 DIAGNOSIS — Z1231 Encounter for screening mammogram for malignant neoplasm of breast: Secondary | ICD-10-CM

## 2011-03-02 ENCOUNTER — Ambulatory Visit
Admission: RE | Admit: 2011-03-02 | Discharge: 2011-03-02 | Disposition: A | Discharge status: Home / Self Care | Payer: Self-pay | Source: Ambulatory Visit | Attending: Family Medicine | Admitting: Family Medicine

## 2011-03-02 DIAGNOSIS — N63 Unspecified lump in unspecified breast: Secondary | ICD-10-CM

## 2011-04-03 ENCOUNTER — Emergency Department (HOSPITAL_COMMUNITY)
Admission: EM | Admit: 2011-04-03 | Discharge: 2011-04-03 | Disposition: A | Discharge status: Home / Self Care | Payer: Self-pay | Source: Home / Self Care | Attending: Family Medicine | Admitting: Family Medicine

## 2011-04-03 ENCOUNTER — Encounter (HOSPITAL_COMMUNITY): Payer: Self-pay | Admitting: *Deleted

## 2011-04-03 DIAGNOSIS — R05 Cough: Secondary | ICD-10-CM

## 2011-04-03 DIAGNOSIS — J310 Chronic rhinitis: Secondary | ICD-10-CM

## 2011-04-03 HISTORY — DX: Unspecified ovarian cyst, unspecified side: N83.209

## 2011-04-03 MED ORDER — GUAIFENESIN-CODEINE 100-10 MG/5ML PO SYRP: 5.0000 mL | ORAL_SOLUTION | Freq: Three times a day (TID) | ORAL | Status: AC | PRN

## 2011-04-03 MED ORDER — FLUTICASONE PROPIONATE 50 MCG/ACT NA SUSP: 2.0000 | Freq: Every day | NASAL | Status: DC

## 2011-04-03 NOTE — ED Notes (Signed)
Went to PCP approx 1 month ago - was given albuterol HFA for cough & got better; approx 1 wk ago started w/ productive cough again.  Started w/ sore throat and right earache 3 days ago.  Has been taking IBU and albuterol prn.  Denies fevers.

## 2011-04-03 NOTE — ED Provider Notes (Addendum)
History     CSN: [id number] Arrival date & time: 04/03/2011  5:38 PM   First MD Initiated Contact with Patient 04/03/11 1756      Chief Complaint  Patient presents with  . Cough  . Otalgia  . Headache    (Consider location/radiation/quality/duration/timing/severity/associated sxs/prior treatment) HPI Comments: Maria Stephenson presents for evaluation of persistent cough, headache, nasal congestion over the last week. She saw her PCP and was given albuterol and Robitussin DM. She denies any fever. She reports mild improvement with the inhaler and cough syrup.   Patient is a 45 y.o. female presenting with cough. The history is provided by the patient. The history is limited by a language barrier. No language interpreter was used.  Cough This is a new problem. The problem occurs constantly. The cough is non-productive. There has been no fever. Associated symptoms include ear pain, headaches, rhinorrhea and wheezing. She has tried cough syrup for the symptoms. The treatment provided mild relief.    Past Medical History  Diagnosis Date  . Shortness of breath     H/O sob since 04/2010 intubation  . Sleep apnea     wakes occasional gasping for breath  . Chronic kidney disease     recent kidney infection  . Recurrent upper respiratory infection (URI)   . Depression   . Headache   . Arthritis     joint pain left elbow  . GERD (gastroesophageal reflux disease)   . Ovarian cyst     Past Surgical History  Procedure Date  . Breast surgery   . Salpingoophorectomy 12/22/2010    ELAP, RSO done for large dermoid cyst  . Laparotomy 12/22/2010    ELAP, RSO done for large dermoid cyst    Family History  Problem Relation Age of Onset  . Anesthesia problems Sister     History  Substance Use Topics  . Smoking status: Never Smoker   . Smokeless tobacco: Never Used  . Alcohol Use: No    OB History    Grav Para Term Preterm Abortions TAB SAB Ect Mult Living   3 3 3  0 0 0 0 0 0 3      Review  of Systems  Constitutional: Negative.   HENT: Positive for ear pain, congestion and rhinorrhea.   Eyes: Negative.   Respiratory: Positive for cough and wheezing.   Gastrointestinal: Negative.   Genitourinary: Negative.   Skin: Negative.   Neurological: Positive for headaches.    Allergies  Review of patient's allergies indicates no known allergies.  Home Medications   Current Outpatient Rx  Name Route Sig Dispense Refill  . ALBUTEROL IN Inhalation Inhale into the lungs as needed.      . IBUPROFEN 600 MG PO TABS Oral Take 600 mg by mouth every 6 (six) hours as needed.      Marland Kitchen PANTOPRAZOLE SODIUM 40 MG PO TBEC Oral Take 40 mg by mouth daily.        BP 134/81  Pulse 68  Temp(Src) 98.4 F (36.9 C) (Oral)  Resp 18  SpO2 100%  LMP 03/29/2011  Physical Exam  Constitutional: She is oriented to person, place, and time. She appears well-developed and well-nourished.  HENT:  Head: Normocephalic and atraumatic.  Right Ear: Tympanic membrane is retracted. Tympanic membrane is not erythematous.  Left Ear: Tympanic membrane is retracted. Tympanic membrane is not erythematous.  Mouth/Throat: Uvula is midline and oropharynx is clear and moist.  Cardiovascular: Normal rate and regular rhythm.   Pulmonary/Chest: Effort normal  and breath sounds normal. She has no wheezes. She has no rhonchi. She has no rales.  Neurological: She is alert and oriented to person, place, and time.  Skin: Skin is warm and dry.    ED Course  Procedures (including critical care time)  Labs Reviewed - No data to display No results found.   No diagnosis found.    MDM          Richardo Priest, MD 04/03/11 1610  Richardo Priest, MD 04/03/11 9604  Richardo Priest, MD 04/06/11 5409  Richardo Priest, MD 04/06/11 8119

## 2011-07-15 ENCOUNTER — Other Ambulatory Visit: Payer: Self-pay | Admitting: Family Medicine

## 2012-01-09 ENCOUNTER — Emergency Department (HOSPITAL_COMMUNITY)
Admission: EM | Admit: 2012-01-09 | Discharge: 2012-01-09 | Disposition: A | Discharge status: Home / Self Care | Payer: Self-pay | Source: Home / Self Care | Attending: Emergency Medicine | Admitting: Emergency Medicine

## 2012-01-09 ENCOUNTER — Encounter (HOSPITAL_COMMUNITY): Payer: Self-pay | Admitting: *Deleted

## 2012-01-09 DIAGNOSIS — J069 Acute upper respiratory infection, unspecified: Secondary | ICD-10-CM

## 2012-01-09 DIAGNOSIS — B353 Tinea pedis: Secondary | ICD-10-CM

## 2012-01-09 MED ORDER — IBUPROFEN 600 MG PO TABS: 600.0000 mg | ORAL_TABLET | Freq: Four times a day (QID) | ORAL | Status: AC | PRN

## 2012-01-09 MED ORDER — FLUTICASONE PROPIONATE 50 MCG/ACT NA SUSP: 2.0000 | Freq: Every day | NASAL | Status: AC

## 2012-01-09 MED ORDER — CLOTRIMAZOLE 1 % EX CREA: TOPICAL_CREAM | CUTANEOUS | Status: AC

## 2012-01-09 MED ORDER — SALINE NASAL SPRAY 0.65 % NA SOLN: 1.0000 | NASAL | Status: AC | PRN

## 2012-01-09 NOTE — ED Notes (Signed)
Pt kept informed

## 2012-01-09 NOTE — ED Notes (Signed)
Pt with c/o right earache onset x one month - rash bilateral feet x 6 weeks itching

## 2012-01-09 NOTE — ED Provider Notes (Signed)
History     CSN: [id number]  Arrival date & time 01/09/12  1103   None     Chief Complaint  Patient presents with  . Otalgia  . Rash    (Consider location/radiation/quality/duration/timing/severity/associated sxs/prior treatment) Patient is a 46 y.o. female presenting with ear pain and rash. The history is provided by the patient.  Otalgia Associated symptoms include rash.  Rash   Maria Stephenson is a 46 y.o. female who complains of facial pain for one month, ear pressure and intermittent headache associated.   + sore throat + cough + pleuritic pain No wheezing No nasal congestion No post-nasal drainage + sinus pain/pressure No voice changes No chest congestion No itchy/red eyes + earache No hemoptysis + SOB + chills/sweats No fever No nausea No vomiting No abdominal pain No diarrhea No skin rashes No fatigue + myalgias No headache  No ill contacts   This patient complains of a pruritic rash.  Location: bilateral feet  Onset: 1 month ago   Course: unchanged Self-treated with: cortisone           Improvement with treatment: minimal  History Itching: yes  Tenderness: no  New medications/antibiotics: no  Pet exposure: no  Recent travel or tropical exposure: no  New soaps, shampoos, detergent, clothing: no Tick/insect exposure: no   Red Flags Feeling ill: no Fever:no Facial/tongue swelling/difficulty breathing:  no  Diabetic or immunocompromised: no Patient wears plastic tennis shoes daily.        Past Medical History  Diagnosis Date  . Shortness of breath     H/O sob since 04/2010 intubation  . Sleep apnea     wakes occasional gasping for breath  . Chronic kidney disease     recent kidney infection  . Recurrent upper respiratory infection (URI)   . Depression   . Headache   . Arthritis     joint pain left elbow  . GERD (gastroesophageal reflux disease)   . Ovarian cyst     Past Surgical History  Procedure Date  . Breast surgery   .  Salpingoophorectomy 12/22/2010    ELAP, RSO done for large dermoid cyst  . Laparotomy 12/22/2010    ELAP, RSO done for large dermoid cyst    Family History  Problem Relation Age of Onset  . Anesthesia problems Sister     History  Substance Use Topics  . Smoking status: Never Smoker   . Smokeless tobacco: Never Used  . Alcohol Use: No    OB History    Grav Para Term Preterm Abortions TAB SAB Ect Mult Living   3 3 3  0 0 0 0 0 0 3      Review of Systems  Skin: Positive for rash.  All other systems reviewed and are negative.    Allergies  Review of patient's allergies indicates no known allergies.  Home Medications   Current Outpatient Rx  Name Route Sig Dispense Refill  . RANITIDINE HCL 150 MG PO CAPS Oral Take 150 mg by mouth 2 (two) times daily.    . ALBUTEROL IN Inhalation Inhale into the lungs as needed.      Marland Kitchen CLOTRIMAZOLE 1 % EX CREA  Apply to affected area 2 times daily 45 g 2  . FLUTICASONE PROPIONATE 50 MCG/ACT NA SUSP Nasal Place 2 sprays into the nose daily. 16 g 2  . IBUPROFEN 600 MG PO TABS Oral Take 1 tablet (600 mg total) by mouth every 6 (six) hours as needed. 30 tablet 2  .  PANTOPRAZOLE SODIUM 40 MG PO TBEC Oral Take 40 mg by mouth daily.      Marland Kitchen SALINE NASAL SPRAY 0.65 % NA SOLN Nasal Place 1 spray into the nose as needed for congestion. 30 mL 12    BP 137/86  Pulse 70  Temp 98.2 F (36.8 C) (Oral)  Resp 18  SpO2 99%  Physical Exam  Nursing note and vitals reviewed. Constitutional: Maria Stephenson is oriented to person, place, and time. Vital signs are normal. Maria Stephenson appears well-developed and well-nourished. Maria Stephenson is active and cooperative.  HENT:  Head: Normocephalic.  Right Ear: Hearing, tympanic membrane, external ear and ear canal normal.  Left Ear: Hearing, tympanic membrane, external ear and ear canal normal.  Mouth/Throat: Uvula is midline and mucous membranes are normal. Posterior oropharyngeal edema and posterior oropharyngeal erythema present. No  oropharyngeal exudate or tonsillar abscesses.  Eyes: Conjunctivae are normal. Pupils are equal, round, and reactive to light. No scleral icterus.  Neck: Trachea normal. Neck supple.  Cardiovascular: Normal rate, regular rhythm, normal heart sounds and normal pulses.   Pulmonary/Chest: Effort normal and breath sounds normal.  Lymphadenopathy:    Maria Stephenson has cervical adenopathy.       Right cervical: Superficial cervical adenopathy present.       Left cervical: Superficial cervical adenopathy present.  Neurological: Maria Stephenson is alert and oriented to person, place, and time. No cranial nerve deficit or sensory deficit.  Skin: Skin is warm and dry. Rash noted.       Dry scaly rash to plantar surface of both feet, right great toe mild onchymycosis at distal end.  Psychiatric: Maria Stephenson has a normal mood and affect. Her speech is normal and behavior is normal. Judgment and thought content normal. Cognition and memory are normal.    ED Course  Procedures (including critical care time)   Labs Reviewed  POCT RAPID STREP A ([facility] ONLY)   No results found.   1. URI (upper respiratory infection)   2. Tinea pedis       MDM  You need to restart your flonase to prevent allergies/sinusiitis symptoms.  Use nasal spray to assist in clearing your congestion as needed, take ibuprofen or tylenol for fever/discomfort.  Use clotrimazole cream as twice daily to bilateral feet, keep feet clean and dry.  Return if symptoms are not improved or worsen.          Johnsie Kindred, NP 01/09/12 1233

## 2012-08-11 IMAGING — CR DG UGI W/ HIGH DENSITY W/KUB
1 series · 1 of 1 positions shown · non-contrast
Comparison: None.

CLINICAL DATA: Dysphagia and reflux

UPPER GI SERIES WITH KUB
TECHNIQUE: Routine upper GI series was performed with thin and
high density barium.

[t abd/barium *]
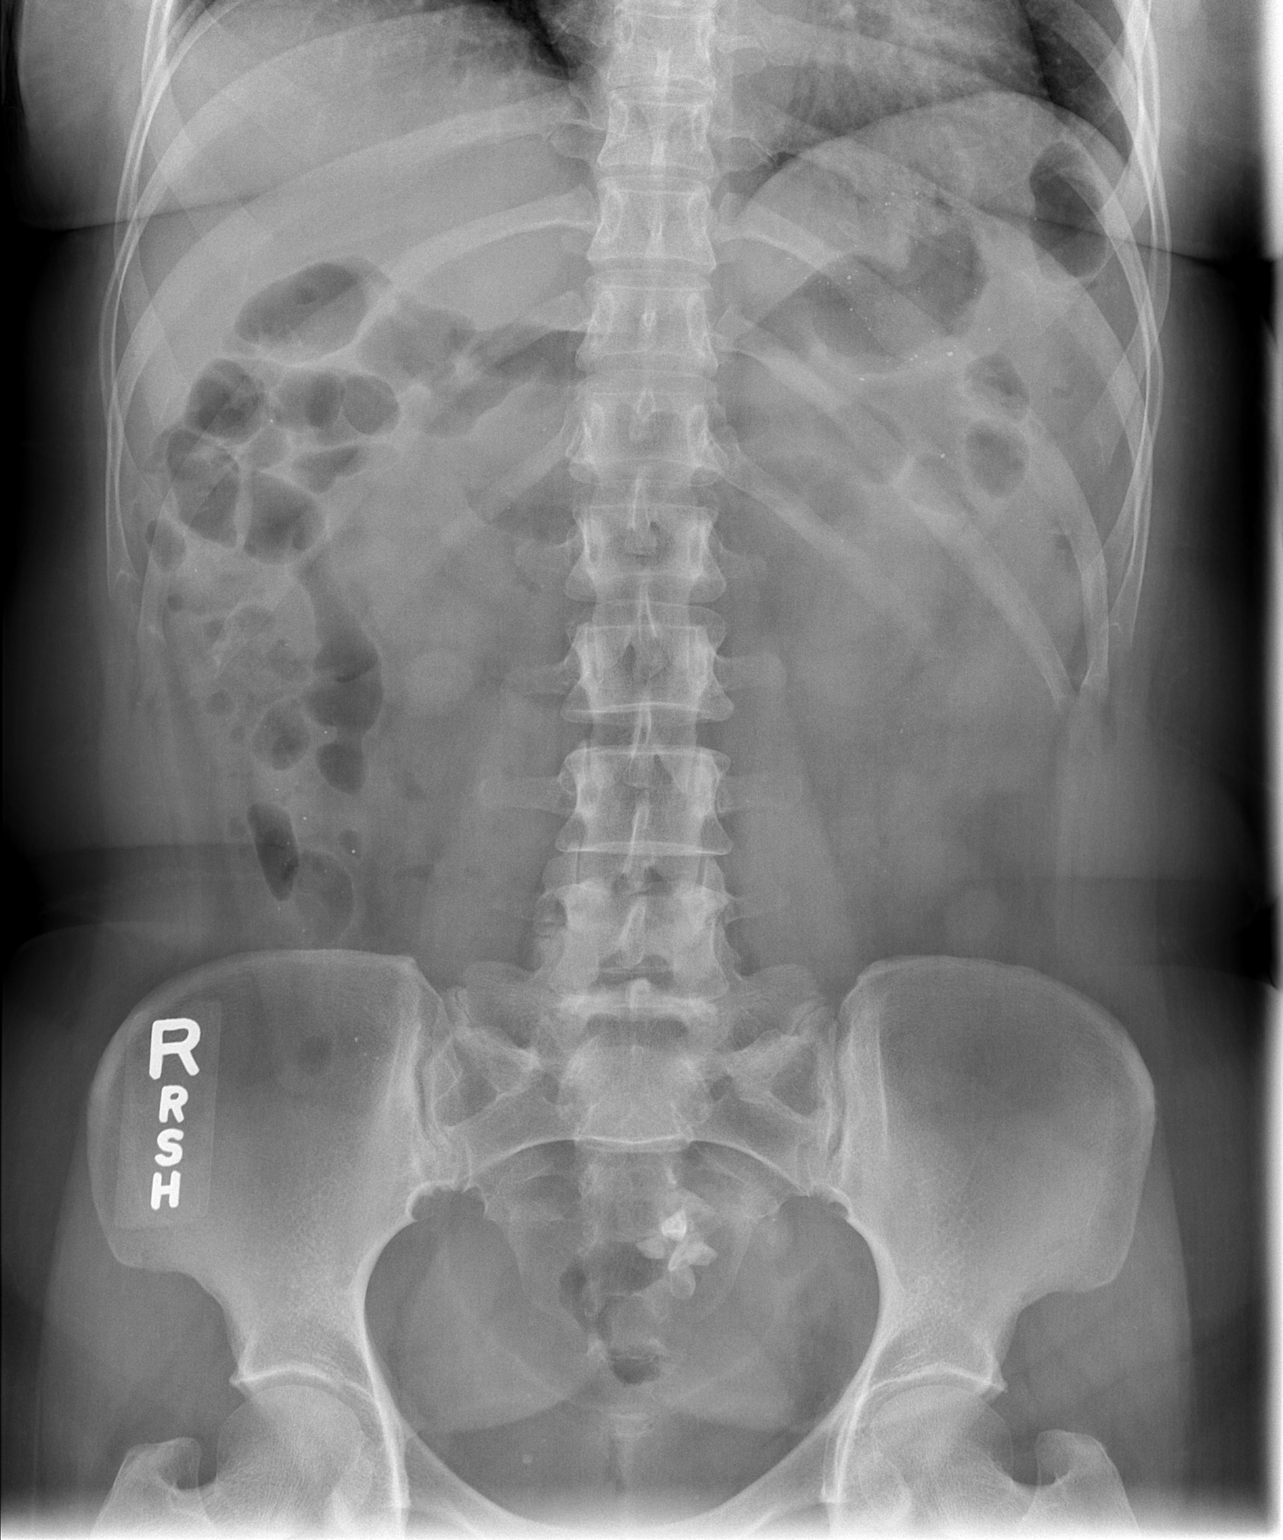

[1 of 1 positions shown; findings below may reference images not displayed]

FINDINGS: There are tertiary contractions within the esophagus
during the exam.  Otherwise, the esophagus, stomach, duodenal bulb,
and remainder of the C-loop have a normal appearance with no
evidence of stricture, holdup, or ulceration.  There is mild
prominence of the cricopharyngeus muscle.
IMPRESSION: There are tertiary contractions throughout the esophagus during the
exam.  Otherwise, the exam is normal.

## 2012-10-13 IMAGING — CT CT ABD-PELV W/ CM
2 of 5 series · 17 of 46 positions shown, 19 images · IV contrast (APPLIED)
Comparison: Plain film 09/22/2010

CLINICAL DATA: Right lower quadrant abdominal pain, nausea, and
vomiting.

CT ABDOMEN AND PELVIS WITH CONTRAST
TECHNIQUE: Multidetector CT imaging of the abdomen and pelvis was
performed following the standard protocol during bolus
administration of intravenous contrast.
Contrast: 80 ml 8mnipaque-IAA

[Series 2: abd/pelv with 5.0 b31f st · axial · 0.60mm/px · z∈[+814,+1219]mm · 14 of 91 slices shown, 16 images]
[im 5/91  soft-tissue]
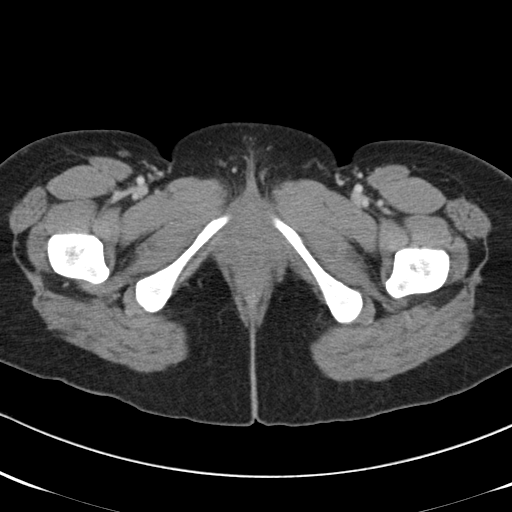
[im 5/91  bone]
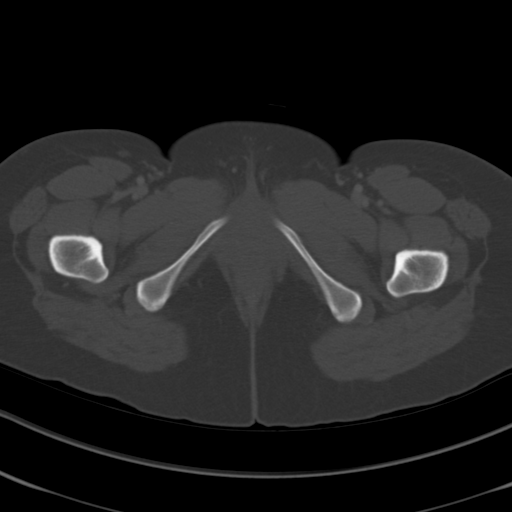
[im 14/91  soft-tissue]
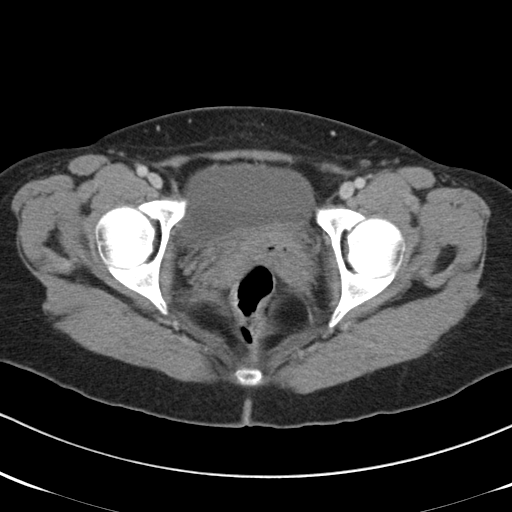
[im 19/91  soft-tissue]
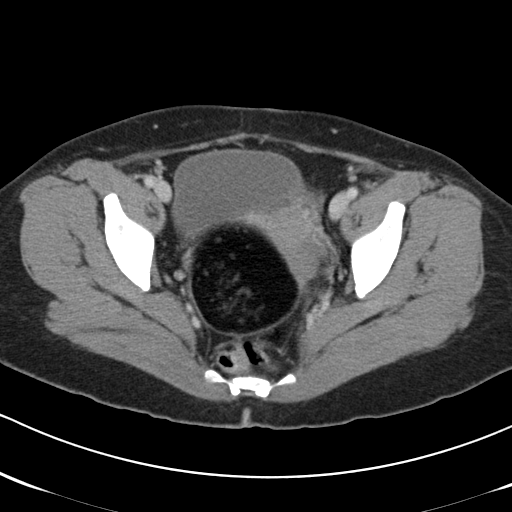
[im 23/91  soft-tissue]
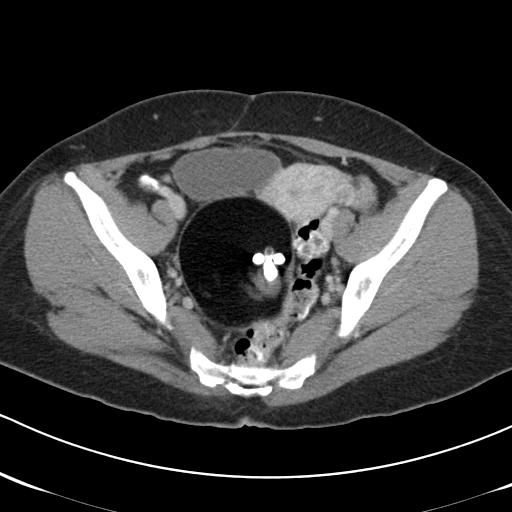
[im 32/91  soft-tissue]
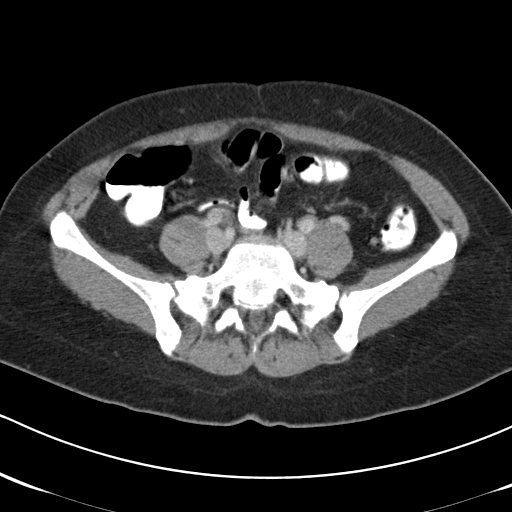
[im 37/91  soft-tissue]
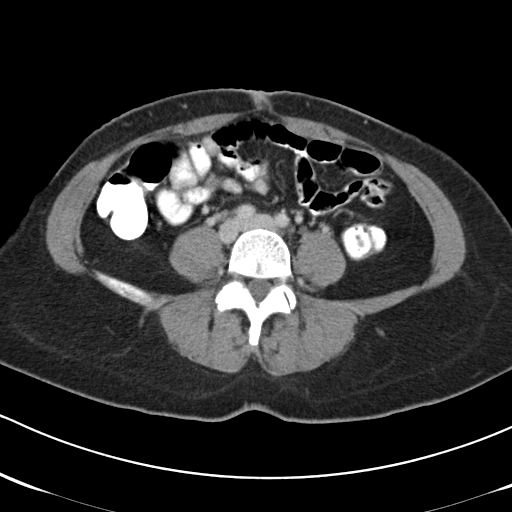
[im 41/91  soft-tissue]
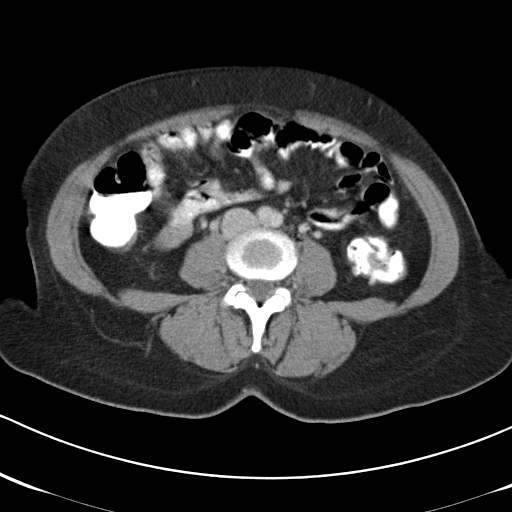
[im 50/91  soft-tissue]
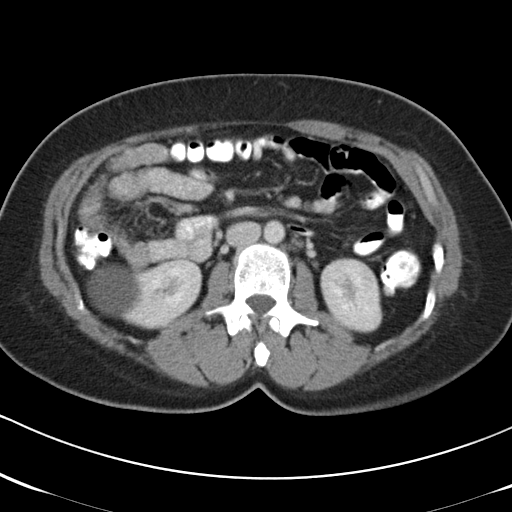
[im 55/91  soft-tissue]
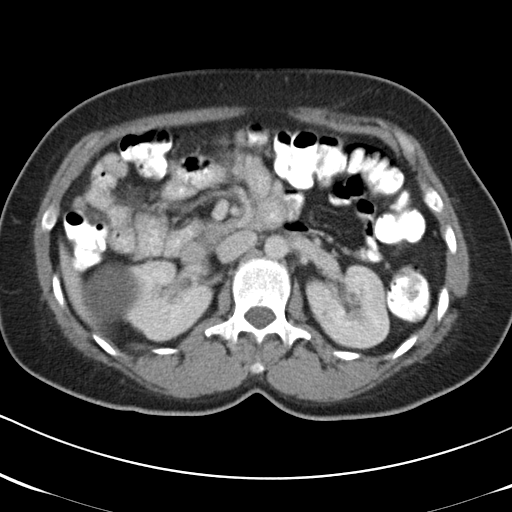
[im 55/91  bone]
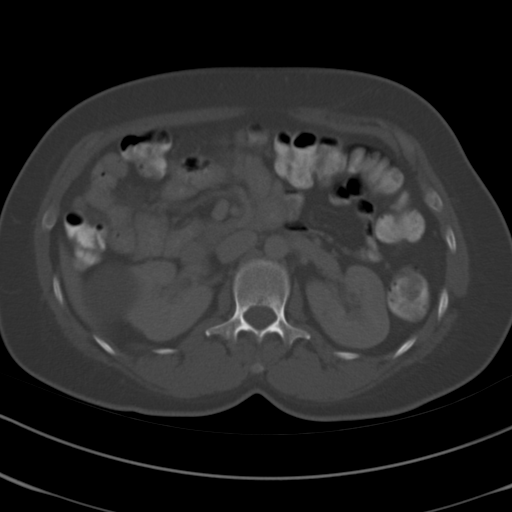
[im 59/91  soft-tissue]
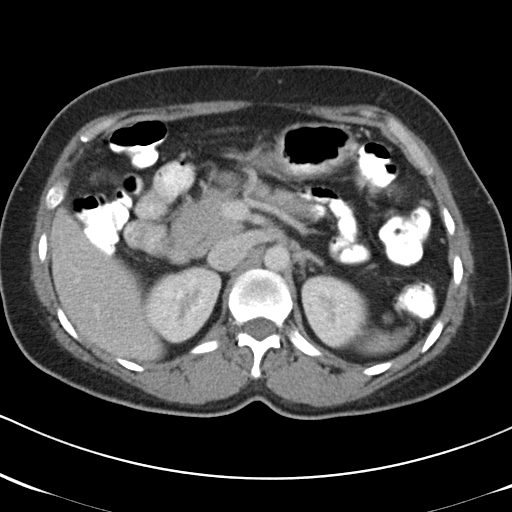
[im 68/91  soft-tissue]
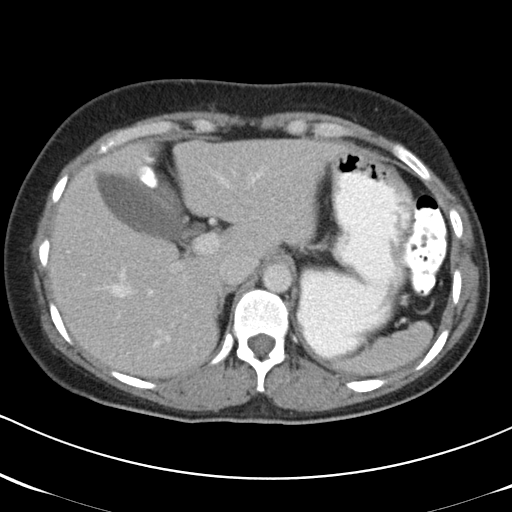
[im 73/91  soft-tissue]
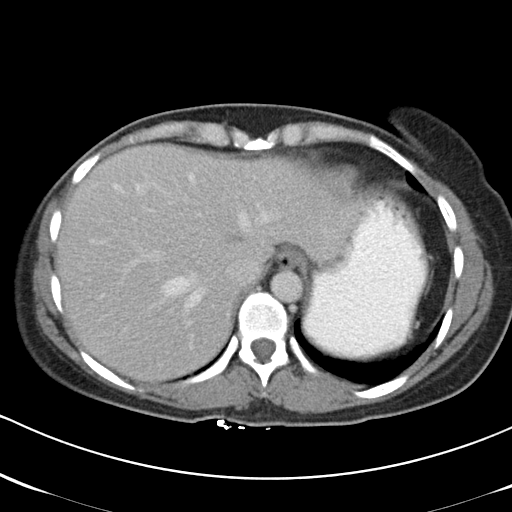
[im 77/91  soft-tissue]
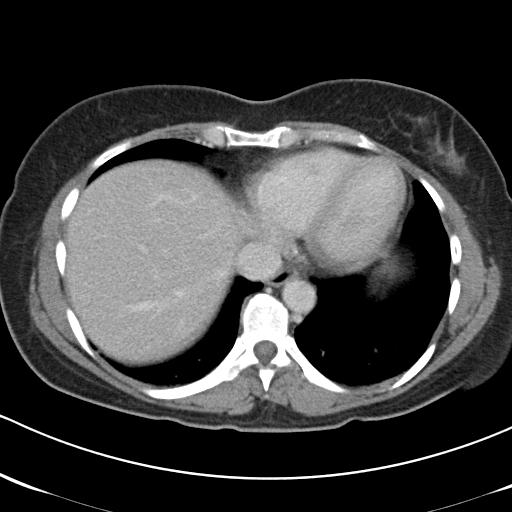
[im 86/91  soft-tissue]
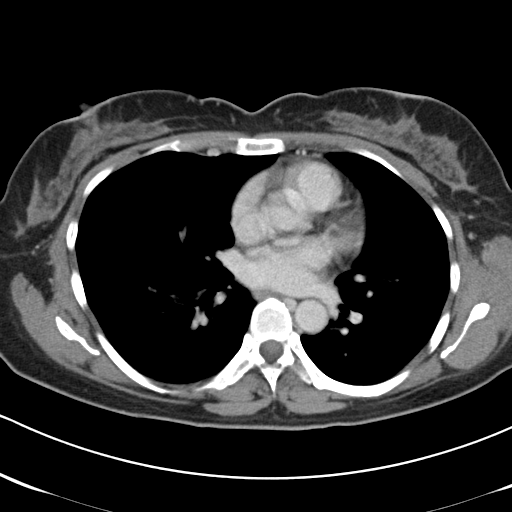

[Series 602: cor · coronal · 0.89mm/px · 3 of 98 slices shown]
[im 33/98  soft-tissue]
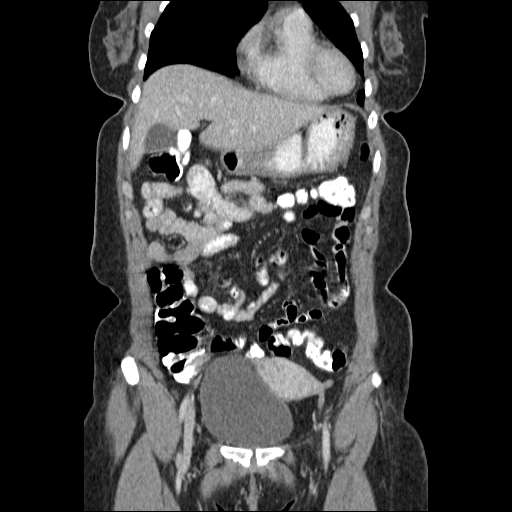
[im 44/98  soft-tissue]
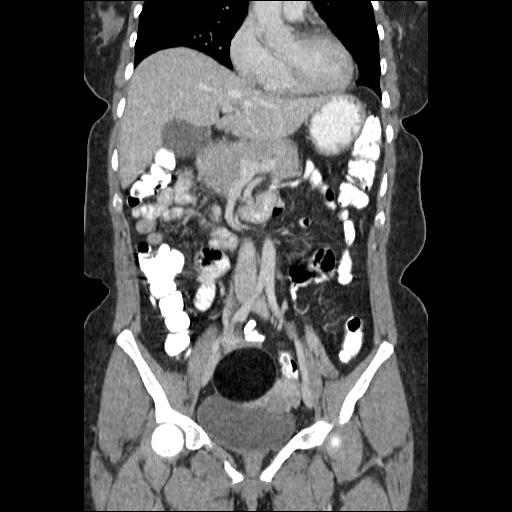
[im 54/98  soft-tissue]
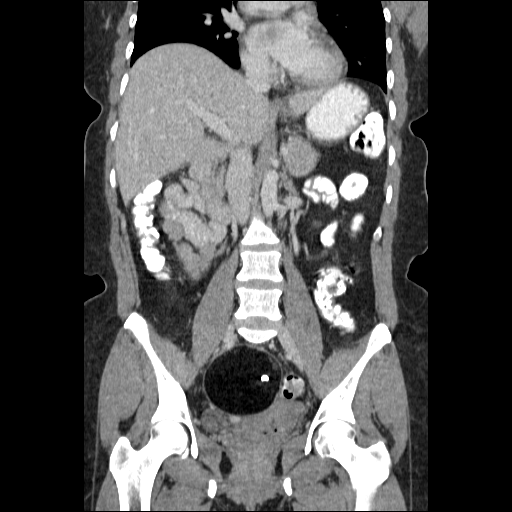

[17 of 46 positions shown; findings below may reference images not displayed]

FINDINGS: Mild dependent atelectasis in the lung bases.  Small cyst
in the posterior inferior right lobe of liver.  Liver parenchyma is
otherwise homogeneous.  Spleen size is normal.  Small accessory
spleen.  The gallbladder, bile ducts, pancreas, adrenal glands, and
aorta are unremarkable.  Cysts in the right kidney measuring up to
4.2 cm diameter.  No solid mass or hydronephrosis in either kidney.
No retroperitoneal lymphadenopathy.  Stomach and small bowel are
not distended.  No abdominal ascites or free air.

Pelvis:  Large mass in the pelvis containing fat and calcification
consistent with a dermoid cyst.  This measures about 7.5 cm in
diameter.  The uterus is displaced by the mass but is otherwise
unremarkable.  The bladder wall is not thickened.  There is a small
calcification in the nondependent bladder wall.  No free or
loculated pelvic fluid collections.  The appendix is normal.  No
inflammatory changes demonstrated in the colon.
IMPRESSION: Large dermoid cyst in the pelvis.  No acute inflammatory process
demonstrated.  Normal appendix.  No evidence of bowel obstruction.

## 2014-03-19 ENCOUNTER — Encounter (HOSPITAL_COMMUNITY): Payer: Self-pay | Admitting: *Deleted

## 2017-05-07 ENCOUNTER — Encounter (HOSPITAL_COMMUNITY): Payer: Self-pay | Admitting: Emergency Medicine

## 2017-05-07 ENCOUNTER — Ambulatory Visit (HOSPITAL_COMMUNITY)
Admission: EM | Admit: 2017-05-07 | Discharge: 2017-05-07 | Disposition: A | Discharge status: Home / Self Care | Payer: Self-pay | Attending: Internal Medicine | Admitting: Internal Medicine

## 2017-05-07 DIAGNOSIS — I10 Essential (primary) hypertension: Secondary | ICD-10-CM

## 2017-05-07 DIAGNOSIS — K219 Gastro-esophageal reflux disease without esophagitis: Secondary | ICD-10-CM

## 2017-05-07 DIAGNOSIS — M1388 Other specified arthritis, other site: Secondary | ICD-10-CM

## 2017-05-07 DIAGNOSIS — R1013 Epigastric pain: Secondary | ICD-10-CM

## 2017-05-07 DIAGNOSIS — M1990 Unspecified osteoarthritis, unspecified site: Secondary | ICD-10-CM

## 2017-05-07 LAB — POCT I-STAT, CHEM 8
BUN: 12 mg/dL (ref 6–20)
CALCIUM ION: 1.16 mmol/L (ref 1.15–1.40)
CREATININE: 0.6 mg/dL (ref 0.44–1.00)
Chloride: 104 mmol/L (ref 101–111)
GLUCOSE: 139 mg/dL — AB (ref 65–99)
HCT: 47 % — ABNORMAL HIGH (ref 36.0–46.0)
Hemoglobin: 16 g/dL — ABNORMAL HIGH (ref 12.0–15.0)
Potassium: 3.9 mmol/L (ref 3.5–5.1)
Sodium: 144 mmol/L (ref 135–145)
TCO2: 28 mmol/L (ref 22–32)

## 2017-05-07 MED ORDER — GI COCKTAIL ~~LOC~~
ORAL | Status: AC
  Filled 2017-05-07: qty 30

## 2017-05-07 MED ORDER — OMEPRAZOLE 20 MG PO CPDR: 20.0000 mg | DELAYED_RELEASE_CAPSULE | Freq: Every day | ORAL | 0 refills | Status: AC

## 2017-05-07 MED ORDER — GI COCKTAIL ~~LOC~~
30.0000 mL | Freq: Once | ORAL | Status: AC
  Administered 2017-05-07: 30 mL via ORAL

## 2017-05-07 NOTE — ED Triage Notes (Signed)
PT C/O: pt reports BP was 144/90 and 167/91 when she took it at a local pharmacy.... Pt also c/o epigastric pain onset 3 days and LLE numbness x3 days  Hx of GERD ... Takes omeprazole   For leg pain she has been taking Celebrex   ONSET: 3 days   DENIES: inj/trauma  TAKING MEDS:   A&O x4... NAD... Ambulatory

## 2017-05-07 NOTE — Discharge Instructions (Signed)
Ice and elevation of left knee for pain. Celebrex daily, take with food. Omeprazole daily for GERD symptoms, see attached information. If symptoms worsen or do not improve in the next week to return to be seen or to follow up with PCP.

## 2017-05-07 NOTE — ED Provider Notes (Signed)
[facility]    CSN: [id number] Arrival date & time: 05/07/17  1734     History   Chief Complaint Chief Complaint  Patient presents with  . Hypertension  . Abdominal Pain    HPI Maria Stephenson is a 51 y.o. female.   Maria Stephenson presents with family with complaints of epigastric pain which has been present for the past 3 days and has worsened. Feels nauseated, without vomiting. Feels similar to reflux she has had in the past, omeprazole has helped but she has run out of this. Without fevers. Denies urinary symptoms. States has had to strain to pass stool, last bm today. Without chest pain, diaphoresis, shortness of breath. Ate ramen noodles today which seemed to help abdominal pain. Also with left leg pain which has been chronic in nature, worse with activity. Without injury or known exacerbating factor. Does take celebrex for this but has not taken in the past few days. Pain to posterior knee and proximal calf. Rates pain 6/10 Spanish interpretation used to collect history and physical.    ROS per HPI.       Past Medical History:  Diagnosis Date  . Arthritis    joint pain left elbow  . Chronic kidney disease    recent kidney infection  . Depression   . GERD (gastroesophageal reflux disease)   . Headache(784.0)   . Ovarian cyst   . Recurrent upper respiratory infection (URI)   . Shortness of breath    H/O sob since 04/2010 intubation  . Sleep apnea    wakes occasional gasping for breath    There are no active problems to display for this patient.   Past Surgical History:  Procedure Laterality Date  . BREAST SURGERY    . LAPAROTOMY  12/22/2010   ELAP, RSO done for large dermoid cyst  . SALPINGOOPHORECTOMY  12/22/2010   ELAP, RSO done for large dermoid cyst    OB History    Gravida Para Term Preterm AB Living   3 3 3  0 0 3   SAB TAB Ectopic Multiple Live Births   0 0 0 0         Home Medications    Prior to Admission medications     Medication Sig Start Date End Date Taking? Authorizing Provider  celecoxib (CELEBREX) 200 MG capsule Take 200 mg by mouth 2 (two) times daily.   Yes [provider]  ALBUTEROL IN Inhale into the lungs as needed.      [provider]  fluticasone (FLONASE) 50 MCG/ACT nasal spray Place 2 sprays into the nose daily. 01/09/12 01/08/13  Chatten, Lolita Cram, NP  ibuprofen (ADVIL,MOTRIN) 600 MG tablet Take 1 tablet (600 mg total) by mouth every 6 (six) hours as needed. 01/09/12   Chatten, Lolita Cram, NP  omeprazole (PRILOSEC) 20 MG capsule Take 1 capsule (20 mg total) by mouth daily. 05/07/17   Zigmund Gottron, NP  pantoprazole (PROTONIX) 40 MG tablet Take 40 mg by mouth daily.      [provider]  ranitidine (ZANTAC) 150 MG capsule Take 150 mg by mouth 2 (two) times daily.    [provider]  sodium chloride (OCEAN NASAL SPRAY) 0.65 % nasal spray Place 1 spray into the nose as needed for congestion. 01/09/12 01/08/13  Awilda Metro, NP    Family History Family History  Problem Relation Age of Onset  . Anesthesia problems Sister     Social History Social History   Tobacco  Use  . Smoking status: Never Smoker  . Smokeless tobacco: Never Used  Substance Use Topics  . Alcohol use: No  . Drug use: No     Allergies   Penicillins   Review of Systems Review of Systems   Physical Exam Triage Vital Signs ED Triage Vitals  Enc Vitals Group     BP 05/07/17 1755 139/76     Pulse Rate 05/07/17 1755 72     Resp 05/07/17 1755 16     Temp 05/07/17 1755 98.2 F (36.8 C)     Temp Source 05/07/17 1755 Oral     SpO2 05/07/17 1755 100 %     Weight --      Height --      Head Circumference --      Peak Flow --      Pain Score 05/07/17 1752 6     Pain Loc --      Pain Edu? --      Excl. in [place]? --    No data found.  Updated Vital Signs BP 139/76 (BP Location: Left Arm)   Pulse 72   Temp 98.2 F (36.8 C) (Oral)   Resp 16   SpO2 100%   Visual  Acuity Right Eye Distance:   Left Eye Distance:   Bilateral Distance:    Right Eye Near:   Left Eye Near:    Bilateral Near:     Physical Exam  Constitutional: She is oriented to person, place, and time. She appears well-developed and well-nourished. No distress.  Cardiovascular: Normal rate, regular rhythm and normal heart sounds.  Pulmonary/Chest: Effort normal and breath sounds normal.  Abdominal: There is tenderness in the epigastric area and suprapubic area. There is no rigidity, no rebound, no guarding, no CVA tenderness, no tenderness at McBurney's point and negative Murphy's sign.  Musculoskeletal:       Left knee: She exhibits normal range of motion, no swelling, no effusion, normal patellar mobility, no bony tenderness, normal meniscus and no MCL laxity. No tenderness found.       Legs: Left posterior knee with mild tenderness; without redness, swelling or firmness noted; full ROM without pain with extension, flexion or stress to mcl or lcl; negative anterior drawer; sensation intact; strength equal to bilateral lower legs  Neurological: She is alert and oriented to person, place, and time.  Skin: Skin is warm and dry.  Vitals reviewed.    UC Treatments / Results  Labs (all labs ordered are listed, but only abnormal results are displayed) Labs Reviewed  POCT I-STAT, CHEM 8 - Abnormal; Notable for the following components:      Result Value   Glucose, Bld 139 (*)    Hemoglobin 16.0 (*)    HCT 47.0 (*)    All other components within normal limits    EKG  EKG Interpretation None       Radiology No results found.  Procedures Procedures (including critical care time)  Medications Ordered in UC Medications  gi cocktail (Maalox,Lidocaine,Donnatal) (30 mLs Oral Given 05/07/17 1832)     Initial Impression / Assessment and Plan / UC Course  I have reviewed the triage vital signs and the nursing notes.  Pertinent labs & imaging results that were available  during my care of the patient were reviewed by me and considered in my medical decision making (see chart for details).     Creatinine WNL today, discussed ok to resume taking celebrex as needed for leg pain. Take  with food. Ice, elevation for increased pain. Patient no longer with epigastric pain s/p gi cocktail. Daily omeprazole recommended with gerd education provided. If symptoms worsen or do not improve in the next week to return to be seen or to follow up with PCP.  Patient verbalized understanding and agreeable to plan.  Ambulatory out of clinic without difficulty.    Final Clinical Impressions(s) / UC Diagnoses   Final diagnoses:  Gastroesophageal reflux disease, esophagitis presence not specified  Arthritis    ED Discharge Orders        Ordered    omeprazole (PRILOSEC) 20 MG capsule  Daily     05/07/17 1913       Controlled Substance Prescriptions [place] Controlled Substance Registry consulted? Not Applicable   Zigmund Gottron, NP 05/07/17 1914

## 2019-03-09 ENCOUNTER — Other Ambulatory Visit: Payer: Self-pay | Admitting: Cardiology

## 2019-03-09 DIAGNOSIS — Z20822 Contact with and (suspected) exposure to covid-19: Secondary | ICD-10-CM

## 2019-03-11 LAB — NOVEL CORONAVIRUS, NAA: SARS-CoV-2, NAA: NOT DETECTED

## 2021-09-01 ENCOUNTER — Other Ambulatory Visit: Payer: Self-pay

## 2021-09-01 ENCOUNTER — Encounter (HOSPITAL_COMMUNITY): Payer: Self-pay | Admitting: Emergency Medicine

## 2021-09-01 ENCOUNTER — Ambulatory Visit (HOSPITAL_COMMUNITY)
Admission: EM | Admit: 2021-09-01 | Discharge: 2021-09-01 | Disposition: A | Discharge status: Home / Self Care | Payer: Self-pay | Attending: Family Medicine | Admitting: Family Medicine

## 2021-09-01 DIAGNOSIS — T7840XA Allergy, unspecified, initial encounter: Secondary | ICD-10-CM | POA: Insufficient documentation

## 2021-09-01 HISTORY — DX: Type 2 diabetes mellitus without complications: E11.9

## 2021-09-01 LAB — POCT RAPID STREP A, ED / UC: Streptococcus, Group A Screen (Direct): NEGATIVE

## 2021-09-01 MED ORDER — TRIAMCINOLONE ACETONIDE 40 MG/ML IJ SUSP
INTRAMUSCULAR | Status: AC
  Filled 2021-09-01: qty 1

## 2021-09-01 MED ORDER — FAMOTIDINE 20 MG PO TABS: 20.0000 mg | ORAL_TABLET | Freq: Two times a day (BID) | ORAL | 0 refills | Status: AC

## 2021-09-01 MED ORDER — PREDNISONE 20 MG PO TABS: 40.0000 mg | ORAL_TABLET | Freq: Every day | ORAL | 0 refills | Status: AC

## 2021-09-01 MED ORDER — TRIAMCINOLONE ACETONIDE 40 MG/ML IJ SUSP
40.0000 mg | Freq: Once | INTRAMUSCULAR | Status: AC
  Administered 2021-09-01: 40 mg via INTRAMUSCULAR

## 2021-09-01 NOTE — ED Triage Notes (Signed)
Felt like face was hot and heavy.  Family member noticed hives.  At that time felt like throat was closing slightly. Incident took place around 5 pm.   ? ?Took coughing medicine prior to event.   ? ?Daughter is in treatment room translating ?

## 2021-09-01 NOTE — ED Provider Notes (Signed)
?[facility] ? ? ? ?CSN: [id number] ?Arrival date & time: 09/01/21  1759 ? ? ?  ? ?History   ?Chief Complaint ?Chief Complaint  ?Patient presents with  ? Allergic Reaction  ? ? ?HPI ?Maria Stephenson is a 56 y.o. female.  ? ? ?Allergic Reaction ?Here for some feeling of her throat closing and itching in her neck/throat/face. Feeling began a little this AM, and then worsened about 2 hrs ago. Her face felt hot/heavy. Family member saw hives briefly  ? ?No f/c/cough/v/d. She has had a little rhinorrhea and cough beginning 2 days ago. ? ?Takes losartan for htn., metformin for prediabetes. ? ?Past Medical History:  ?Diagnosis Date  ? Arthritis   ? joint pain left elbow  ? Chronic kidney disease   ? recent kidney infection  ? Depression   ? Diabetes mellitus without complication ([place])   ? GERD (gastroesophageal reflux disease)   ? Headache(784.0)   ? Ovarian cyst   ? Recurrent upper respiratory infection (URI)   ? Shortness of breath   ? H/O sob since 04/2010 intubation  ? Sleep apnea   ? wakes occasional gasping for breath  ? ? ?There are no problems to display for this patient. ? ? ?Past Surgical History:  ?Procedure Laterality Date  ? BREAST SURGERY    ? LAPAROTOMY  12/22/2010  ? ELAP, RSO done for large dermoid cyst  ? SALPINGOOPHORECTOMY  12/22/2010  ? ELAP, RSO done for large dermoid cyst  ? ? ?OB History   ? ? Gravida  ?3  ? Para  ?3  ? Term  ?3  ? Preterm  ?0  ? AB  ?0  ? Living  ?3  ?  ? ? SAB  ?0  ? IAB  ?0  ? Ectopic  ?0  ? Multiple  ?0  ? Live Births  ?   ?   ?  ?  ? ? ? ?Home Medications   ? ?Prior to Admission medications   ?Medication Sig Start Date End Date Taking? Authorizing Provider  ?famotidine (PEPCID) 20 MG tablet Take 1 tablet (20 mg total) by mouth 2 (two) times daily. 09/01/21  Yes Barrett Henle, MD  ?predniSONE (DELTASONE) 20 MG tablet Take 2 tablets (40 mg total) by mouth daily with breakfast for 5 days. 09/01/21 09/06/21 Yes Barrett Henle, MD  ?ALBUTEROL IN Inhale into the  lungs as needed.      [provider]  ?celecoxib (CELEBREX) 200 MG capsule Take 200 mg by mouth 2 (two) times daily.    [provider]  ?fluticasone (FLONASE) 50 MCG/ACT nasal spray Place 2 sprays into the nose daily. 01/09/12 01/08/13  Awilda Metro, NP  ?ibuprofen (ADVIL,MOTRIN) 600 MG tablet Take 1 tablet (600 mg total) by mouth every 6 (six) hours as needed. 01/09/12   Chatten, Lolita Cram, NP  ?metFORMIN (GLUCOPHAGE) 500 MG tablet metformin 500 mg tablet ? Take 1 tablet twice a day by oral route.    [provider]  ?omeprazole (PRILOSEC) 20 MG capsule Take 1 capsule (20 mg total) by mouth daily. 05/07/17   Zigmund Gottron, NP  ?pantoprazole (PROTONIX) 40 MG tablet Take 40 mg by mouth daily.      [provider]  ?sodium chloride (OCEAN NASAL SPRAY) 0.65 % nasal spray Place 1 spray into the nose as needed for congestion. 01/09/12 01/08/13  Awilda Metro, NP  ? ? ?Family History ?Family History  ?Problem Relation Age of Onset  ?  Anesthesia problems Sister   ? ? ?Social History ?Social History  ? ?Tobacco Use  ? Smoking status: Never  ? Smokeless tobacco: Never  ?Vaping Use  ? Vaping Use: Never used  ?Substance Use Topics  ? Alcohol use: No  ? Drug use: No  ? ? ? ?Allergies   ?Penicillins ? ? ?Review of Systems ?Review of Systems ? ? ?Physical Exam ?Triage Vital Signs ?ED Triage Vitals  ?Enc Vitals Group  ?   BP 09/01/21 1803 137/81  ?   Pulse Rate 09/01/21 1803 84  ?   Resp 09/01/21 1803 16  ?   Temp --   ?   Temp Source 09/01/21 1803 Oral  ?   SpO2 09/01/21 1803 100 %  ?   Weight --   ?   Height --   ?   Head Circumference --   ?   Peak Flow --   ?   Pain Score 09/01/21 1903 0  ?   Pain Loc --   ?   Pain Edu? --   ?   Excl. in [place]? --   ? ?No data found. ? ?Updated Vital Signs ?BP 137/81 (BP Location: Right Arm)   Pulse 84   Resp 16   SpO2 100%  ? ?Visual Acuity ?Right Eye Distance:   ?Left Eye Distance:   ?Bilateral Distance:   ? ?Right Eye Near:   ?Left Eye Near:     ?Bilateral Near:    ? ?Physical Exam ?Vitals reviewed.  ?Constitutional:   ?   General: She is not in acute distress. ?   Appearance: She is not toxic-appearing.  ?HENT:  ?   Nose: Nose normal.  ?   Mouth/Throat:  ?   Mouth: Mucous membranes are moist.  ?   Comments: There is some mild erythema of the tonsils with tonsillar hypertrophy about 2+. ?Eyes:  ?   Extraocular Movements: Extraocular movements intact.  ?   Conjunctiva/sclera: Conjunctivae normal.  ?   Pupils: Pupils are equal, round, and reactive to light.  ?Cardiovascular:  ?   Rate and Rhythm: Normal rate and regular rhythm.  ?   Heart sounds: No murmur heard. ?Pulmonary:  ?   Effort: Pulmonary effort is normal. No respiratory distress.  ?   Breath sounds: No wheezing, rhonchi or rales.  ?Chest:  ?   Chest wall: No tenderness.  ?Musculoskeletal:  ?   Cervical back: Neck supple.  ?Lymphadenopathy:  ?   Cervical: No cervical adenopathy.  ?Skin: ?   Capillary Refill: Capillary refill takes less than 2 seconds.  ?   Coloration: Skin is not jaundiced or pale.  ?Neurological:  ?   General: No focal deficit present.  ?   Mental Status: She is alert and oriented to person, place, and time.  ?Psychiatric:     ?   Behavior: Behavior normal.  ? ? ? ?UC Treatments / Results  ?Labs ?(all labs ordered are listed, but only abnormal results are displayed) ?Labs Reviewed  ?CULTURE, GROUP A STREP [facility])  ?POCT RAPID STREP A, ED / UC  ? ? ?EKG ? ? ?Radiology ?No results found. ? ?Procedures ?Procedures (including critical care time) ? ?Medications Ordered in UC ?Medications  ?triamcinolone acetonide (KENALOG-40) injection 40 mg (has no administration in time range)  ? ? ?Initial Impression / Assessment and Plan / UC Course  ?I have reviewed the triage vital signs and the nursing notes. ? ?Pertinent labs & imaging results that were available  during my care of the patient were reviewed by me and considered in my medical decision making (see chart for details). ? ?  ? ?Rapid  strep neg, done due to the noted pain. C/S sent. ?Will treat with steroids, zyrtec and pepcid ?Final Clinical Impressions(s) / UC Diagnoses  ? ?Final diagnoses:  ?Allergic reaction, initial encounter  ? ? ? ?Discharge Instructions   ? ?  ?Rapid strep test was negative. ? ?You have been given a shot of triamcinolone, a steroid. ? ?Take prednisone 20 mg --2 daily for 5 days. ? ?Take famotidine 20 mg--1 tab 2 times daily for about a week. ? ?See your primary doctor in the next 1-2 weeks. ? ? ? ? ?ED Prescriptions   ? ? Medication Sig Dispense Auth. Provider  ? predniSONE (DELTASONE) 20 MG tablet Take 2 tablets (40 mg total) by mouth daily with breakfast for 5 days. 10 tablet Barrett Henle, MD  ? famotidine (PEPCID) 20 MG tablet Take 1 tablet (20 mg total) by mouth 2 (two) times daily. 30 tablet Barrett Henle, MD  ? ?  ? ?PDMP not reviewed this encounter. ?  ?Barrett Henle, MD ?09/01/21 2003 ? ?

## 2021-09-01 NOTE — Discharge Instructions (Addendum)
Rapid strep test was negative. ? ?You have been given a shot of triamcinolone, a steroid. ? ?Take prednisone 20 mg --2 daily for 5 days. ? ?Take famotidine 20 mg--1 tab 2 times daily for about a week. ? ?See your primary doctor in the next 1-2 weeks. ?

## 2021-09-01 NOTE — ED Triage Notes (Signed)
Pt reports a sore throat since this morning and SOB.  ?

## 2021-09-04 LAB — CULTURE, GROUP A STREP (THRC)

## 2024-05-15 ENCOUNTER — Ambulatory Visit (HOSPITAL_COMMUNITY)
Admission: EM | Admit: 2024-05-15 | Discharge: 2024-05-15 | Disposition: A | Discharge status: Home / Self Care | Payer: Self-pay

## 2024-05-15 ENCOUNTER — Encounter (HOSPITAL_COMMUNITY): Payer: Self-pay

## 2024-05-15 DIAGNOSIS — B9689 Other specified bacterial agents as the cause of diseases classified elsewhere: Secondary | ICD-10-CM

## 2024-05-15 DIAGNOSIS — H109 Unspecified conjunctivitis: Secondary | ICD-10-CM

## 2024-05-15 DIAGNOSIS — J019 Acute sinusitis, unspecified: Secondary | ICD-10-CM

## 2024-05-15 MED ORDER — POLYMYXIN B-TRIMETHOPRIM 10000-0.1 UNIT/ML-% OP SOLN: 1.0000 [drp] | Freq: Four times a day (QID) | OPHTHALMIC | 0 refills | Status: AC

## 2024-05-15 MED ORDER — CEFDINIR 300 MG PO CAPS: 300.0000 mg | ORAL_CAPSULE | Freq: Two times a day (BID) | ORAL | 0 refills | Status: AC

## 2024-05-15 NOTE — ED Provider Notes (Signed)
 " [facility]    CSN: [id number] Arrival date & time: 05/15/24  1110      History   Chief Complaint No chief complaint on file.   HPI Maria Stephenson is a 58 y.o. female.   Spanish video interpretor used for this encounter   Patient has been having right sided headache, right eye swelling x1 week Right eye has been more swollen over the past few days, this is what prompted her to come to clinic  Headache is concerning, has been taking tylenol  and motrin , this helps temporarily but right sided headache returns Strong headache that spread to head and ear as well as the right side of her face, throat and neck  Symptoms have been ongoing for the past week w/o improvement  Used OTC ear drops and OTC eye drops for itchiness  Also has been having purulent drainage with right eye redness x1 week with itching  Has had nasal drainage and rhinorrhea x1 week   Second shingles vaccine on 12/23, denies rash or vesicles   The history is provided by the patient and medical records. The history is limited by a language barrier. A language interpreter was used.    Past Medical History:  Diagnosis Date   Arthritis    joint pain left elbow   Chronic kidney disease    recent kidney infection   Depression    Diabetes mellitus without complication (HCC)    GERD (gastroesophageal reflux disease)    Headache(784.0)    Ovarian cyst    Recurrent upper respiratory infection (URI)    Shortness of breath    H/O sob since 04/2010 intubation   Sleep apnea    wakes occasional gasping for breath    There are no active problems to display for this patient.   Past Surgical History:  Procedure Laterality Date   BREAST SURGERY     LAPAROTOMY  12/22/2010   ELAP, RSO done for large dermoid cyst   SALPINGOOPHORECTOMY  12/22/2010   ELAP, RSO done for large dermoid cyst    OB History     Gravida  3   Para  3   Term  3   Preterm  0   AB  0   Living  3      SAB  0    IAB  0   Ectopic  0   Multiple  0   Live Births               Home Medications    Prior to Admission medications  Medication Sig Start Date End Date Taking? Authorizing Provider  cefdinir (OMNICEF) 300 MG capsule Take 1 capsule (300 mg total) by mouth 2 (two) times daily for 7 days. 05/15/24 05/22/24 Yes Shiheem Corporan  Stephenson, Maria Stephenson  ibuprofen  (ADVIL ,MOTRIN ) 600 MG tablet Take 1 tablet (600 mg total) by mouth every 6 (six) hours as needed. 01/09/12  Yes Chatten, Carmen L, NP  metFORMIN (GLUCOPHAGE) 500 MG tablet metformin 500 mg tablet  Take 1 tablet twice a day by oral route.   Yes [provider]  trimethoprim -polymyxin b  (POLYTRIM ) ophthalmic solution Place 1 drop into the right eye every 6 (six) hours for 5 days. 05/15/24 05/20/24 Yes Joell Buerger  Stephenson, Maria Stephenson  ALBUTEROL IN Inhale into the lungs as needed.      [provider]  celecoxib (CELEBREX) 200 MG capsule Take 200 mg by mouth 2 (two) times daily.    [provider]  famotidine  (PEPCID ) 20  MG tablet Take 1 tablet (20 mg total) by mouth 2 (two) times daily. 09/01/21   Vonna Sharlet POUR, MD  fluticasone  (FLONASE ) 50 MCG/ACT nasal spray Place 2 sprays into the nose daily. 01/09/12 01/08/13  Chatten, Dedra CROME, NP  losartan (COZAAR) 50 MG tablet Take 50 mg by mouth daily.    [provider]  omeprazole  (PRILOSEC) 20 MG capsule Take 1 capsule (20 mg total) by mouth daily. 05/07/17   Burky, Natalie B, NP  pantoprazole  (PROTONIX ) 40 MG tablet Take 40 mg by mouth daily.      [provider]  sodium chloride  (OCEAN NASAL SPRAY) 0.65 % nasal spray Place 1 spray into the nose as needed for congestion. 01/09/12 01/08/13  Towana Dedra CROME, NP    Family History Family History  Problem Relation Age of Onset   Anesthesia problems Sister     Social History Social History[1]   Allergies   Penicillins   Review of Systems Review of Systems  Per HPI  Physical Exam Triage Vital Signs ED Triage  Vitals  Encounter Vitals Group     BP 05/15/24 1326 123/74     Girls Systolic BP Percentile --      Girls Diastolic BP Percentile --      Boys Systolic BP Percentile --      Boys Diastolic BP Percentile --      Pulse Rate 05/15/24 1326 72     Resp 05/15/24 1326 16     Temp 05/15/24 1326 98.8 F (37.1 C)     Temp Source 05/15/24 1326 Oral     SpO2 05/15/24 1326 98 %     Weight --      Height --      Head Circumference --      Peak Flow --      Pain Score 05/15/24 1322 8     Pain Loc --      Pain Education --      Exclude from Growth Chart --    No data found.  Updated Vital Signs BP 123/74 (BP Location: Right Arm)   Pulse 72   Temp 98.8 F (37.1 C) (Oral)   Resp 16   LMP 03/29/2011   SpO2 98%   Visual Acuity Right Eye Distance:   Left Eye Distance:   Bilateral Distance:    Right Eye Near:   Left Eye Near:    Bilateral Near:     Physical Exam Vitals and nursing note reviewed.  Constitutional:      Appearance: Normal appearance.  HENT:     Head: Normocephalic and atraumatic.     Right Ear: Tympanic membrane, ear canal and external ear normal.     Left Ear: Tympanic membrane, ear canal and external ear normal.     Nose: Nose normal.     Mouth/Throat:     Mouth: Mucous membranes are moist.  Eyes:     General:        Right eye: Discharge present.  Cardiovascular:     Rate and Rhythm: Normal rate and regular rhythm.     Heart sounds: Normal heart sounds. No murmur heard. Pulmonary:     Effort: Pulmonary effort is normal. No respiratory distress.     Breath sounds: Normal breath sounds. No wheezing.  Skin:    General: Skin is warm and dry.  Neurological:     General: No focal deficit present.     Mental Status: She is alert.  Psychiatric:  Mood and Affect: Mood normal.        Behavior: Behavior is cooperative.      UC Treatments / Results  Labs (all labs ordered are listed, but only abnormal results are displayed) Labs Reviewed - No data to  display  EKG   Radiology No results found.  Procedures Procedures (including critical care time)  Medications Ordered in UC Medications - No data to display  Initial Impression / Assessment and Plan / UC Course  I have reviewed the triage vital signs and the nursing notes.  Pertinent labs & imaging results that were available during my care of the patient were reviewed by me and considered in my medical decision making (see chart for details).  Vitals and triage reviewed, patient is hemodynamically stable.  Lungs vesicular, heart with regular rate and rhythm.  Right-sided conjunctiva is injected with mucopurulent discharge.  Concern for bacterial conjunctivitis, will cover with Polytrim .  Sinus tenderness to palpation over right side.  Continues to have nasal congestion, rhinorrhea and sinus headache.  Will cover with cefdinir for acute sinusitis.  Plan of care, follow-up care and return precautions given, no questions at this time.    Final Clinical Impressions(s) / UC Diagnoses   Final diagnoses:  Acute bacterial sinusitis  Bacterial conjunctivitis of right eye     Discharge Instructions      Tome cefdinir dos veces al da con las comidas durante los prximos 7 das para tratar la sinusitis bacteriana. Aplquese las gotas antibiticas en el ojo derecho cuatro veces al [employer] prximos 5 [place]. Las compresas tibias o fras pueden ayudar a technical sales engineer irritacin ocular. Lvese las manos antes y despus de aplicarse las gotas. El paracetamol y el ibuprofeno pueden ayudar a engineer, materials. Los sntomas deberan mejorar con los antibiticos; si no observa mejora o si experimenta algn cambio, consulte con su mdico.  Take the cefdinir twice daily with food for the next 7 days to treat bacterial sinusitis.  Use the antibiotic eyedrops 4 times daily for the next 5 days to the right eye.  Warm or cold compress can help soothe the eye.  Wash hands before and after placing  the drops.  Tylenol  and ibuprofen  can help with any pain.  Symptoms should improve with antibiotics, if no improvement or any changes seek follow-up care.     ED Prescriptions     Medication Sig Dispense Auth. Provider   cefdinir (OMNICEF) 300 MG capsule Take 1 capsule (300 mg total) by mouth 2 (two) times daily for 7 days. 14 capsule Maria Stephenson, Maria Barrett  Stephenson, Maria Stephenson   trimethoprim -polymyxin b  (POLYTRIM ) ophthalmic solution Place 1 drop into the right eye every 6 (six) hours for 5 days. 10 mL Maria Stephenson, Maria Eustache  Stephenson, Maria Stephenson      PDMP not reviewed this encounter.     [1]  Social History Tobacco Use   Smoking status: Never   Smokeless tobacco: Never  Vaping Use   Vaping status: Never Used  Substance Use Topics   Alcohol use: No   Drug use: No     Maria Stephenson Rendell SAILOR, Maria Stephenson 05/15/24 1413  "

## 2024-05-15 NOTE — Discharge Instructions (Signed)
 Tome cefdinir toys 'r' us al da con las comidas durante los prximos 7 das para tratar la sinusitis bacteriana. Aplquese las gotas antibiticas en el ojo derecho cuatro veces al [employer] [place]os [address] [place]. Las compresas tibias o fras pueden ayudar a technical sales engineer irritacin ocular. Lvese las manos antes y despus de aplicarse las gotas. El paracetamol y el ibuprofeno pueden ayudar a engineer, materials. Los sntomas deberan mejorar con los antibiticos; si no observa mejora o si experimenta algn cambio, consulte con su mdico.  Take the cefdinir twice daily with food for the next 7 days to treat bacterial sinusitis.  Use the antibiotic eyedrops 4 times daily for the next 5 days to the right eye.  Warm or cold compress can help soothe the eye.  Wash hands before and after placing the drops.  Tylenol  and ibuprofen  can help with any pain.  Symptoms should improve with antibiotics, if no improvement or any changes seek follow-up care.

## 2024-05-15 NOTE — ED Triage Notes (Addendum)
 Pt c/o of cough, sore throat, (R) sided headache, (R) eye swelling, and (R) ear pain since last Thursday. Pt only needs reading glasses. Pt describes as an ear infection type of pain. Denies nausea and vomiting. Pt reports body aches and diarrhea last Thursday and Friday. Pt did receive 2nd Shingles vaccine on 05/09/24, pt is not sure if symptoms are related. Pt has been alternating Tylenol  and Motrin . (R) eye is itchy and some watery drainage, and some blurry vision. Pt has been using OTC eye drops for itchiness and redness, last used last night. Pt also used OTC ear drops last night.   Daughter Hargis is interpreting via telephone for patient. Patient declined formal interpreter.
